# Patient Record
Sex: Female | Born: 1960 | Race: White | Hispanic: No | Marital: Married | Smoking: Never smoker
Health system: Southern US, Community
[De-identification: ages and names within clinical notes are randomized; demographics above are authoritative.]

## PROBLEM LIST (undated history)

## (undated) DIAGNOSIS — E785 Hyperlipidemia, unspecified: Secondary | ICD-10-CM

## (undated) HISTORY — DX: Hyperlipidemia, unspecified: E78.5

---
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
## 1988-02-10 HISTORY — PX: ABDOMINOPLASTY: SUR9

## 1998-02-09 HISTORY — PX: BLADDER SUSPENSION: SHX72

## 1998-03-04 ENCOUNTER — Emergency Department (HOSPITAL_COMMUNITY): Admission: EM | Admit: 1998-03-04 | Discharge: 1998-03-04 | Payer: Self-pay | Admitting: Emergency Medicine

## 1998-03-04 ENCOUNTER — Encounter: Payer: Self-pay | Admitting: Emergency Medicine

## 2005-04-29 ENCOUNTER — Ambulatory Visit (HOSPITAL_BASED_OUTPATIENT_CLINIC_OR_DEPARTMENT_OTHER): Admission: RE | Admit: 2005-04-29 | Discharge: 2005-04-29 | Payer: Self-pay | Admitting: Urology

## 2009-11-13 ENCOUNTER — Encounter: Admission: RE | Admit: 2009-11-13 | Discharge: 2009-11-13 | Payer: Self-pay | Admitting: Internal Medicine

## 2010-06-27 NOTE — Op Note (Signed)
NAMEDENNIS, KILLILEA                ACCOUNT NO.:  [id number]   MEDICAL RECORD NO.:  [id number]          PATIENT TYPE:  AMB   LOCATION:  [facility]                         FACILITY:  [facility]   PHYSICIAN:  Bertram Millard. Dahlstedt, M.D.DATE OF BIRTH:  [birth year]   DATE OF PROCEDURE:  04/29/2005  DATE OF DISCHARGE:                                 OPERATIVE REPORT   PREOPERATIVE DIAGNOSIS:  Stress urinary incontinence.   POSTOPERATIVE DIAGNOSIS:  Stress urinary incontinence.   SURGICAL PROCEDURES:  Lynx suprapubic sling.   SURGEON:  Dr. Retta Diones   ANESTHESIA:  General with LMA.   COMPLICATIONS:  None.   ESTIMATED BLOOD LOSS:  50 mL.   COMPLICATIONS:  None.   BRIEF HISTORY:  A 50 year old female with significant stress urinary  incontinence.  She has had it for several years.  This is getting worse and  impacts her daily activities.  She has no symptoms of an overactive bladder.  Evaluation of the bladder revealed a positive Marshall test and a relatively  normal-appearing bladder.  She has a minimal cystocele.  She does have some  uterine descent.  At this point, she just desires surgical management of her  stress incontinence.  Risks and complications as well as alternatives to  surgery have been discussed with the patient at length.  She has also  received a book on female incontinence.   DESCRIPTION OF PROCEDURE:  Ms. Teena Dunk was identified in the holding area,  brought to the operating room after IV antibiotics were administered.  She  was placed in the dorsal lithotomy position after general anesthesia was  established.  Genitalia, perineum, and lower abdomen were prepped and  draped.  The posterior vaginal speculum was placed.  Her bladder was  catheterized.  The anterior vaginal wall along the midline was infiltrated  with 1% lidocaine with epinephrine.  Two separate stab incisions were made  just overlying the pubic area by 1 cm off the midline bilaterally.  A  midline incision  was made over the mid urethra and the anterior vaginal  wall.  Dissection was used sharply to raise vaginal flaps.  The needles from  the kit were then passed through the suprapubic stab wounds down to the  space of Retzius and to their corresponding locations on either side of the  urethra through the vaginal incision.  Inspection cystoscopically with both  the 12 the 70-degree lenses revealed no evident bladder injury.  The scope  was then removed.  The sling was then brought into position underneath the  urethra by pulling it up through the space of Retzius bilaterally with the  needles.  It was positioned underneath the urethra with a very small amount  of slack present - enough to admit the mid-aspect of a hemostat handle.  The  sheath was then excised.  Adequate positioning was again seen.  At this  point, the nylon sling was cut off underneath the skin incisions.  The  vaginal incision was then closed using a running 2-0 Vicryl.  A good layer  of vaginal fascia and epithelium was placed over top of  the sling material.  About 150 mL of fluid was left within the bladder.  The vagina was  irrigated.  No pack was left.   The patient tolerated the procedure well.  Sponge, needle, and instrument  counts were correct x2.  She was awakened and taken to the PACU in stable  condition.   She will be sent home after she voids.  A catheter will be placed if  necessary.  She was sent home on Keflex 500 mg b.i.d. for 5 days and  oxycodone/APAP 5/325, 1/2-1 p.o. q. 4 hours p.r.n.).  She will follow up in  1 week.      Bertram Millard. Dahlstedt, M.D.  Electronically Signed     SMD/MEDQ  D:  04/29/2005  T:  04/30/2005  Job:  [id number]

## 2012-11-25 ENCOUNTER — Other Ambulatory Visit (HOSPITAL_COMMUNITY)
Admission: RE | Admit: 2012-11-25 | Discharge: 2012-11-25 | Disposition: A | Discharge status: Home / Self Care | Payer: 59 | Source: Ambulatory Visit | Attending: Internal Medicine | Admitting: Internal Medicine

## 2012-11-25 ENCOUNTER — Other Ambulatory Visit: Payer: Self-pay | Admitting: Internal Medicine

## 2012-11-25 DIAGNOSIS — Z01419 Encounter for gynecological examination (general) (routine) without abnormal findings: Secondary | ICD-10-CM | POA: Insufficient documentation

## 2012-11-25 DIAGNOSIS — Z1151 Encounter for screening for human papillomavirus (HPV): Secondary | ICD-10-CM | POA: Insufficient documentation

## 2016-04-27 ENCOUNTER — Other Ambulatory Visit: Payer: Self-pay | Admitting: Internal Medicine

## 2016-04-27 DIAGNOSIS — Z1231 Encounter for screening mammogram for malignant neoplasm of breast: Secondary | ICD-10-CM

## 2016-05-15 ENCOUNTER — Encounter: Payer: Self-pay | Admitting: Radiology

## 2016-05-15 ENCOUNTER — Ambulatory Visit
Admission: RE | Admit: 2016-05-15 | Discharge: 2016-05-15 | Disposition: A | Discharge status: Home / Self Care | Payer: BLUE CROSS/BLUE SHIELD | Source: Ambulatory Visit | Attending: Internal Medicine | Admitting: Internal Medicine

## 2016-05-15 DIAGNOSIS — Z1231 Encounter for screening mammogram for malignant neoplasm of breast: Secondary | ICD-10-CM

## 2016-08-11 ENCOUNTER — Emergency Department (HOSPITAL_COMMUNITY): Payer: BLUE CROSS/BLUE SHIELD

## 2016-08-11 ENCOUNTER — Encounter (HOSPITAL_COMMUNITY): Payer: Self-pay | Admitting: Emergency Medicine

## 2016-08-11 ENCOUNTER — Emergency Department (HOSPITAL_COMMUNITY)
Admission: EM | Admit: 2016-08-11 | Discharge: 2016-08-11 | Disposition: A | Discharge status: Home / Self Care | Payer: BLUE CROSS/BLUE SHIELD | Attending: Emergency Medicine | Admitting: Emergency Medicine

## 2016-08-11 DIAGNOSIS — Y9352 Activity, horseback riding: Secondary | ICD-10-CM | POA: Diagnosis not present

## 2016-08-11 DIAGNOSIS — S32020A Wedge compression fracture of second lumbar vertebra, initial encounter for closed fracture: Secondary | ICD-10-CM | POA: Insufficient documentation

## 2016-08-11 DIAGNOSIS — T148XXA Other injury of unspecified body region, initial encounter: Secondary | ICD-10-CM

## 2016-08-11 DIAGNOSIS — Y998 Other external cause status: Secondary | ICD-10-CM | POA: Insufficient documentation

## 2016-08-11 DIAGNOSIS — S301XXA Contusion of abdominal wall, initial encounter: Secondary | ICD-10-CM | POA: Insufficient documentation

## 2016-08-11 DIAGNOSIS — S299XXA Unspecified injury of thorax, initial encounter: Secondary | ICD-10-CM | POA: Diagnosis present

## 2016-08-11 DIAGNOSIS — Z79899 Other long term (current) drug therapy: Secondary | ICD-10-CM | POA: Diagnosis not present

## 2016-08-11 DIAGNOSIS — R0781 Pleurodynia: Secondary | ICD-10-CM

## 2016-08-11 DIAGNOSIS — Y929 Unspecified place or not applicable: Secondary | ICD-10-CM | POA: Insufficient documentation

## 2016-08-11 MED ORDER — HYDROCODONE-ACETAMINOPHEN 5-325 MG PO TABS: 1.0000 | ORAL_TABLET | Freq: Three times a day (TID) | ORAL | 0 refills | Status: AC | PRN

## 2016-08-11 MED ORDER — METHOCARBAMOL 500 MG PO TABS: 500.0000 mg | ORAL_TABLET | Freq: Two times a day (BID) | ORAL | 0 refills | Status: DC

## 2016-08-11 MED ORDER — HYDROCODONE-ACETAMINOPHEN 5-325 MG PO TABS
1.0000 | ORAL_TABLET | Freq: Once | ORAL | Status: AC
  Administered 2016-08-11: 1 via ORAL
  Filled 2016-08-11: qty 1

## 2016-08-11 MED ORDER — METHOCARBAMOL 500 MG PO TABS
1000.0000 mg | ORAL_TABLET | Freq: Once | ORAL | Status: AC
  Administered 2016-08-11: 1000 mg via ORAL
  Filled 2016-08-11: qty 2

## 2016-08-11 MED ORDER — ACETAMINOPHEN 500 MG PO TABS: 1000.0000 mg | ORAL_TABLET | Freq: Three times a day (TID) | ORAL | 0 refills | Status: AC

## 2016-08-11 NOTE — ED Notes (Signed)
Pt ambulated to restroom without difficulty

## 2016-08-11 NOTE — ED Provider Notes (Signed)
[facility]-EMERGENCY DEPT Provider Note   CSN: [id number] Arrival date & time: 08/11/16  1314     History   Chief Complaint Chief Complaint  Patient presents with  . Back Pain    HPI Christina Beltran is a 56 y.o. female.  HPI  56 year old female with no pertinent past medical history presents the emergency department after of fall from a horse. Patient reports that the fall occurred at 9:00 this morning, 7 hours prior to arrival. She reports falling off the horse landing onto her back/right flank. Denies any head trauma or loss of consciousness. Patient is endorsing upper and lower back pain as well as right flank pain. She denies any perineal/saddle anesthesia, lower extremity weakness, bladder or bowel incontinence.  History reviewed. No pertinent past medical history.  There are no active problems to display for this patient.   History reviewed. No pertinent surgical history.  OB History    No data available       Home Medications    Prior to Admission medications   Medication Sig Start Date End Date Taking? Authorizing Provider  naproxen sodium (ANAPROX) 220 MG tablet Take 220 mg by mouth 2 (two) times daily with a meal.   Yes [provider]  acetaminophen (TYLENOL) 500 MG tablet Take 2 tablets (1,000 mg total) by mouth every 8 (eight) hours. Do not take more than 4000 mg of acetaminophen (Tylenol) in a 24-hour period. Please note that other medicines that you may be prescribed may have Tylenol as well. 08/11/16 08/16/16  Nira Conn, MD  HYDROcodone-acetaminophen (NORCO/VICODIN) 5-325 MG tablet Take 1 tablet by mouth every 8 (eight) hours as needed for severe pain (That is not improved by your scheduled acetaminophen regimen). Please do not exceed 4000 mg of acetaminophen (Tylenol) a 24-hour period. Please note that he may be prescribed additional medicine that contains acetaminophen. 08/11/16 08/16/16  Nira Conn, MD  methocarbamol (ROBAXIN) 500 MG tablet  Take 1 tablet (500 mg total) by mouth 2 (two) times daily. 08/11/16   Nira Conn, MD    Family History Family History  Problem Relation Age of Onset  . Breast cancer Mother   . Breast cancer Maternal Grandmother     Social History Social History  Substance Use Topics  . Smoking status: Not on file  . Smokeless tobacco: Not on file  . Alcohol use Not on file     Allergies   Patient has no known allergies.   Review of Systems Review of Systems All other systems are reviewed and are negative for acute change except as noted in the HPI  Physical Exam Updated Vital Signs BP 121/79 (BP Location: Left Arm)   Pulse 79   Temp 98.2 F (36.8 C) (Oral)   Resp 16   SpO2 97%   Physical Exam  Constitutional: She is oriented to person, place, and time. She appears well-developed and well-nourished. No distress.  HENT:  Head: Normocephalic and atraumatic.  Right Ear: External ear normal.  Left Ear: External ear normal.  Nose: Nose normal.  Eyes: Conjunctivae and EOM are normal. Pupils are equal, round, and reactive to light. Right eye exhibits no discharge. Left eye exhibits no discharge. No scleral icterus.  Neck: Normal range of motion. Neck supple.  Cardiovascular: Normal rate, regular rhythm and normal heart sounds.  Exam reveals no gallop and no friction rub.   No murmur heard. Pulses:      Radial pulses are 2+ on the right side, and 2+  on the left side.       Dorsalis pedis pulses are 2+ on the right side, and 2+ on the left side.  Pulmonary/Chest: Effort normal and breath sounds normal. No stridor. No respiratory distress. She has no wheezes.  Abdominal: Soft. She exhibits no distension. There is no tenderness.  Musculoskeletal: She exhibits no edema.       Cervical back: She exhibits no bony tenderness.       Thoracic back: She exhibits tenderness. She exhibits no bony tenderness.       Lumbar back: She exhibits tenderness and bony tenderness.        Back:  Clavicles stable. Chest stable to AP/Lat compression. Pelvis stable to Lat compression. No obvious extremity deformity. No chest or abdominal wall contusion.  Neurological: She is alert and oriented to person, place, and time.  Spine Exam: Strength: 5/5 throughout LE bilaterally (hip flexion/extension, adduction/abduction; knee flexion/extension; foot dorsiflexion/plantarflexion, inversion/eversion; great toe inversion) Sensation: Intact to light touch in proximal and distal LE bilaterally Reflexes: 2+ quadriceps and achilles reflexes    Skin: Skin is warm and dry. No rash noted. She is not diaphoretic. No erythema.  Psychiatric: She has a normal mood and affect.     ED Treatments / Results  Labs (all labs ordered are listed, but only abnormal results are displayed) Labs Reviewed - No data to display  EKG  EKG Interpretation None       Radiology Dg Ribs Unilateral W/chest Right  Result Date: 08/11/2016 CLINICAL DATA:  Patient thrown from horse EXAM: RIGHT RIBS AND CHEST - 3+ VIEW COMPARISON:  None. FINDINGS: Frontal chest as well as oblique and cone-down lower rib images were obtained. Lungs are clear. Heart size and pulmonary vascularity are normal. No adenopathy. There is no evident pneumothorax or pleural effusion. No rib fracture appreciable. IMPRESSION: No appreciable rib fracture.  Lungs clear. Electronically Signed   By: Bretta Bang III M.D.   On: 08/11/2016 16:08   Dg Thoracic Spine 2 View  Result Date: 08/11/2016 CLINICAL DATA:  Upper back pain following being thrown from horse, initial encounter EXAM: THORACIC SPINE 2 VIEWS COMPARISON:  None. FINDINGS: Mild degenerative changes of the lower cervical and thoracic spine are noted. No acute fracture is seen. No acute rib abnormality is noted. IMPRESSION: Mild degenerative change without acute abnormality. Electronically Signed   By: Alcide Clever M.D.   On: 08/11/2016 14:50   Dg Lumbar Spine Complete  Result  Date: 08/11/2016 CLINICAL DATA:  Low back pain following being thrown from horse, initial encounter EXAM: LUMBAR SPINE - COMPLETE 4+ VIEW COMPARISON:  None. FINDINGS: Five lumbar type vertebral bodies are well visualized. Vertebral body height is well maintained with the exception of the L2 vertebral body which demonstrates anterior wedging of approximately 20%. Facet hypertrophic changes are noted. No other fractures are seen. Increased soft tissue density is noted consistent with the given clinical history of hematoma. IMPRESSION: L2 compression deformity which given the patient's clinical history is felt to be acute in nature. CT evaluation is recommended for further detail. Degenerative changes without acute abnormality. Electronically Signed   By: Alcide Clever M.D.   On: 08/11/2016 14:52   Dg Pelvis 1-2 Views  Result Date: 08/11/2016 CLINICAL DATA:  Thrown from horse this morning with pelvic pain, initial encounter EXAM: PELVIS - 1-2 VIEW COMPARISON:  None. FINDINGS: There is no evidence of pelvic fracture or diastasis. No pelvic bone lesions are seen. IMPRESSION: No acute abnormality noted. Electronically Signed  By: Alcide CleverMark  Lukens M.D.   On: 08/11/2016 14:53    Procedures Procedures (including critical care time)  Medications Ordered in ED Medications  HYDROcodone-acetaminophen (NORCO/VICODIN) 5-325 MG per tablet 1 tablet (1 tablet Oral Given 08/11/16 1538)  methocarbamol (ROBAXIN) tablet 1,000 mg (1,000 mg Oral Given 08/11/16 1538)     Initial Impression / Assessment and Plan / ED Course  I have reviewed the triage vital signs and the nursing notes.  Pertinent labs & imaging results that were available during my care of the patient were reviewed by me and considered in my medical decision making (see chart for details).     Workup consistent with right flank hematoma, L2 compression fracture. No evidence to suggest cauda equina. Patient provided with by mouth pain medicine and muscle  relaxers.  Recommended lumbar corset for comfort. Patient will need to follow up with neurosurgery in 4 weeks for repeat imaging and further management of her L2 compression fracture.  The patient is safe for discharge with strict return precautions.   Final Clinical Impressions(s) / ED Diagnoses   Final diagnoses:  Fall from horse, initial encounter  Hematoma of right flank, initial encounter  Closed compression fracture of second lumbar vertebra, initial encounter (HCC)  Muscle strain  Rib pain on right side   Disposition: Discharge  Condition: Good  I have discussed the results, Dx and Tx plan with the patient who expressed understanding and agree(s) with the plan. Discharge instructions discussed at great length. The patient was given strict return precautions who verbalized understanding of the instructions. No further questions at time of discharge.    New Prescriptions   ACETAMINOPHEN (TYLENOL) 500 MG TABLET    Take 2 tablets (1,000 mg total) by mouth every 8 (eight) hours. Do not take more than 4000 mg of acetaminophen (Tylenol) in a 24-hour period. Please note that other medicines that you may be prescribed may have Tylenol as well.   HYDROCODONE-ACETAMINOPHEN (NORCO/VICODIN) 5-325 MG TABLET    Take 1 tablet by mouth every 8 (eight) hours as needed for severe pain (That is not improved by your scheduled acetaminophen regimen). Please do not exceed 4000 mg of acetaminophen (Tylenol) a 24-hour period. Please note that he may be prescribed additional medicine that contains acetaminophen.   METHOCARBAMOL (ROBAXIN) 500 MG TABLET    Take 1 tablet (500 mg total) by mouth 2 (two) times daily.    Follow Up: Marden NobleGates, Robert, MD [address] [place] [address] [phone]  Schedule an appointment as soon as possible for a visit  As needed  Julio SicksPool, Henry, MD [address] [place] [address] [phone]  In 4 weeks For close follow up to  assess for L2 compression fracture      Lorell Thibodaux, Amadeo GarnetPedro Eduardo, MD 08/11/16 [phone]

## 2016-08-11 NOTE — ED Notes (Signed)
Patient transported to X-ray 

## 2016-08-11 NOTE — ED Notes (Signed)
There was 61 ml of urine as shown by bladder scan.

## 2016-08-11 NOTE — ED Triage Notes (Signed)
Patient reports she was thrown off of a horse this morning. C/o hematoma to right lower back but c/o pain to entire back that worsens with movement. Denies head injury and LOC.

## 2018-12-30 ENCOUNTER — Other Ambulatory Visit: Payer: Self-pay

## 2018-12-30 DIAGNOSIS — Z20822 Contact with and (suspected) exposure to covid-19: Secondary | ICD-10-CM

## 2019-01-02 LAB — NOVEL CORONAVIRUS, NAA: SARS-CoV-2, NAA: NOT DETECTED

## 2019-01-19 ENCOUNTER — Other Ambulatory Visit: Payer: Self-pay

## 2019-01-19 DIAGNOSIS — Z20822 Contact with and (suspected) exposure to covid-19: Secondary | ICD-10-CM

## 2019-01-21 LAB — NOVEL CORONAVIRUS, NAA: SARS-CoV-2, NAA: NOT DETECTED

## 2019-11-02 ENCOUNTER — Other Ambulatory Visit: Payer: Self-pay | Admitting: Internal Medicine

## 2019-11-02 DIAGNOSIS — Z1231 Encounter for screening mammogram for malignant neoplasm of breast: Secondary | ICD-10-CM

## 2019-11-15 ENCOUNTER — Other Ambulatory Visit: Payer: Self-pay

## 2019-11-15 ENCOUNTER — Ambulatory Visit
Admission: RE | Admit: 2019-11-15 | Discharge: 2019-11-15 | Disposition: A | Discharge status: Home / Self Care | Payer: BC Managed Care – PPO | Source: Ambulatory Visit | Attending: Internal Medicine | Admitting: Internal Medicine

## 2019-11-15 DIAGNOSIS — Z1231 Encounter for screening mammogram for malignant neoplasm of breast: Secondary | ICD-10-CM

## 2019-11-20 ENCOUNTER — Other Ambulatory Visit: Payer: Self-pay

## 2019-11-20 ENCOUNTER — Ambulatory Visit: Payer: BC Managed Care – PPO | Admitting: Cardiology

## 2019-11-20 ENCOUNTER — Encounter: Payer: Self-pay | Admitting: Cardiology

## 2019-11-20 VITALS — BP 132/67 | HR 68 | Resp 15 | Ht 65.0 in | Wt 173.0 lb

## 2019-11-20 DIAGNOSIS — Z01810 Encounter for preprocedural cardiovascular examination: Secondary | ICD-10-CM

## 2019-11-20 DIAGNOSIS — E7800 Pure hypercholesterolemia, unspecified: Secondary | ICD-10-CM

## 2019-11-20 DIAGNOSIS — R9431 Abnormal electrocardiogram [ECG] [EKG]: Secondary | ICD-10-CM

## 2019-11-20 NOTE — Progress Notes (Signed)
Date:  11/20/2019   ID:  Christina Beltran, DOB 10/09/1960, MRN [id number]  PCP:  Josetta Huddle, MD  Cardiologist:  Rex Kras, DO, [facility] (established care 11/20/2019)  REASON FOR CONSULT: Abnormal EKG  REQUESTING PHYSICIAN:  Josetta Huddle, MD [address] [place],  [place] [address]  Chief Complaint  Patient presents with  . New Patient (Initial Visit)  . Surgical Clearance  . Abnormal ECG    HPI  Christina Beltran is a 59 y.o. female who presents to the office with a chief complaint of " abnormal EKG and surgical clearance." Patient's past medical history and cardiovascular risk factors include: History of hyperthyroidism status post radioactive iodine, hypercholesterolemia, vitamin D deficiency.  She is referred to the office at the request of Josetta Huddle, MD for evaluation of abnormal EKG and pre-op clearance.  Patient is being scheduled for a neck lift plastic surgery on December 04, 2019 with Dr. Kellie Simmering at [facility] plastic surgery.  Patient states that the surgery scheduled for 4 hours under general anesthesia.  She denies any chest pain or shortness of breath at rest or with effort related activities.  She is not a diabetic.  No structured exercise program or daily routine.  However, patient states that she rides horses 3 days a week. Denies prior history of coronary artery disease, myocardial infarction, congestive heart failure, deep venous thrombosis, pulmonary embolism, stroke, transient ischemic attack.  She recently went to her PCP for preoperative risk stratification and surface EKG was reported to be abnormal and referred to cardiology for further evaluation and management.  Patient has underlying hypercholesterolemia and is currently not on statin therapy.  Patient is considering statin therapy and will discuss it further with her PCP.  No family history of premature coronary disease or sudden cardiac death.  ALLERGIES: Allergies  Allergen Reactions  . Latex      MEDICATION LIST PRIOR TO VISIT: No outpatient medications have been marked as taking for the 11/20/19 encounter (Office Visit) with Terri Skains, Treyvonne Tata, DO.     PAST MEDICAL HISTORY: Past Medical History:  Diagnosis Date  . Hyperlipidemia     PAST SURGICAL HISTORY: Past Surgical History:  Procedure Laterality Date  . ABDOMINOPLASTY  1990  . BLADDER SUSPENSION  2000  . CESAREAN SECTION  1988  . compound fracture  1978   R arm x3 procedures   . RHINOPLASTY  1980    FAMILY HISTORY: The patient family history includes Breast cancer in her maternal grandmother and mother; Hyperlipidemia in her mother; Hypertension in her mother.  SOCIAL HISTORY:  The patient  reports that she has never smoked. She has never used smokeless tobacco. She reports current alcohol use of about 5.0 standard drinks of alcohol per week. She reports that she does not use drugs.  REVIEW OF SYSTEMS: Review of Systems  Constitutional: Negative for chills and fever.  HENT: Negative for hoarse voice and nosebleeds.   Eyes: Negative for discharge, double vision and pain.  Cardiovascular: Negative for chest pain, claudication, dyspnea on exertion, leg swelling, near-syncope, orthopnea, palpitations, paroxysmal nocturnal dyspnea and syncope.  Respiratory: Negative for hemoptysis and shortness of breath.   Musculoskeletal: Negative for muscle cramps and myalgias.  Gastrointestinal: Negative for abdominal pain, constipation, diarrhea, hematemesis, hematochezia, melena, nausea and vomiting.  Neurological: Negative for dizziness and light-headedness.    PHYSICAL EXAM: Vitals with BMI 11/20/2019 08/11/2016 08/11/2016  Height _0  - -  Weight 173 lbs - -  BMI 75.17 - -  Systolic 001  357 017  Diastolic 67 62 79  Pulse 68 51 79   CONSTITUTIONAL: Well-developed and well-nourished. No acute distress.  SKIN: Skin is warm and dry. No rash noted. No cyanosis. No pallor. No jaundice HEAD: Normocephalic and atraumatic.   EYES: No scleral icterus MOUTH/THROAT: Moist oral membranes.  NECK: No JVD present. No thyromegaly noted. No carotid bruits  LYMPHATIC: No visible cervical adenopathy.  CHEST Normal respiratory effort. No intercostal retractions  LUNGS: Clear to auscultation bilaterally.  No stridor. No wheezes. No rales.  CARDIOVASCULAR: Regular rate and rhythm, positive S1-S2, no murmurs rubs or gallops appreciated. ABDOMINAL: Soft, nontender, nondistended, positive bowel sounds all 4 quadrants, no apparent ascites.  EXTREMITIES: No peripheral edema, 2+ dorsalis pedis and posterior tibial pulses.,  Warm to touch bilaterally. HEMATOLOGIC: No significant bruising NEUROLOGIC: Oriented to person, place, and time. Nonfocal. Normal muscle tone.  PSYCHIATRIC: Normal mood and affect. Normal behavior. Cooperative  CARDIAC DATABASE: EKG: 11/20/2019: Sinus  Rhythm, 70bpm, normal axis, LAE, diffuse TWI in precoridal leads and aVL, lead I, without injury pattern.   Echocardiogram: No results found for this or any previous visit from the past 1095 days.    Stress Testing: No results found for this or any previous visit from the past 1095 days.   Heart Catheterization: None  LABORATORY DATA: External Labs: Collected: 11/02/2019 Creatinine 0.83 mg/dL. eGFR: 70 mL/min per 1.73 m Lipid profile: Total cholesterol 254, triglycerides 194, HDL 52, LDL 166, non-HDL 202 TSH: 3.99   IMPRESSION:    ICD-10-CM   1. Preoperative cardiovascular examination  Z01.810 EKG 12-Lead    PCV ECHOCARDIOGRAM COMPLETE    PCV MYOCARDIAL PERFUSION WO LEXISCAN  2. Abnormal EKG  R94.31 PCV ECHOCARDIOGRAM COMPLETE    PCV MYOCARDIAL PERFUSION WO LEXISCAN  3. Hypercholesteremia  E78.00      RECOMMENDATIONS: [name] is a 59 y.o. female whose past medical history and cardiac risk factors include: History of hyperthyroidism status post radioactive iodine, hypercholesterolemia, vitamin D deficiency.  Abnormal EKG and preop risk  stratification: Patient underlying rhythm is sinus with diffuse T wave inversions in the precordial leads as well as the high lateral leads I and aVL.  No prior EKGs to compare.  Symptomatically she denies any chest pain or shortness of breath.  However patient does not have regular exercise program or daily routine as she follows.  Her cardiovascular risk factors are untreated hypercholesterolemia.  Prior to upcoming noncardiac surgery would recommend an ischemic evaluation. Echocardiogram will be ordered to evaluate for structural heart disease and left ventricular systolic function. Plan exercise nuclear stress test to evaluate for functional capacity and reversible ischemia.  Hypercholesterolemia: Patient states that she will follow-up with her PCP in regards the management of her hypercholesterolemia.  Reemphasized the importance of lifestyle modifications in addition to pharmacological therapy.  Further recommendations to follow.  FINAL MEDICATION LIST END OF ENCOUNTER: No orders of the defined types were placed in this encounter.   No current outpatient medications on file.  Orders Placed This Encounter  Procedures  . PCV MYOCARDIAL PERFUSION WO LEXISCAN  . EKG 12-Lead  . PCV ECHOCARDIOGRAM COMPLETE    There are no Patient Instructions on file for this visit.   --Continue cardiac medications as reconciled in final medication list. --Return in about 2 weeks (around 12/04/2019) for Pre-op and , Review test results. Or sooner if needed. --Continue follow-up with your primary care physician regarding the management of your other chronic comorbid conditions.  Patient's questions and concerns were addressed to  her satisfaction. She voices understanding of the instructions provided during this encounter.   This note was created using a voice recognition software as a result there may be grammatical errors inadvertently enclosed that do not reflect the nature of this encounter. Every  attempt is made to correct such errors.  Rex Kras, [place], [facility]  Pager: [phone] Office: [phone]

## 2019-11-21 ENCOUNTER — Other Ambulatory Visit: Payer: Self-pay

## 2019-11-21 DIAGNOSIS — R9431 Abnormal electrocardiogram [ECG] [EKG]: Secondary | ICD-10-CM

## 2019-11-21 DIAGNOSIS — Z01810 Encounter for preprocedural cardiovascular examination: Secondary | ICD-10-CM

## 2019-11-22 ENCOUNTER — Ambulatory Visit: Payer: BC Managed Care – PPO

## 2019-11-22 ENCOUNTER — Other Ambulatory Visit: Payer: Self-pay

## 2019-11-27 ENCOUNTER — Other Ambulatory Visit: Payer: BC Managed Care – PPO

## 2019-11-29 ENCOUNTER — Ambulatory Visit: Payer: BC Managed Care – PPO

## 2019-11-29 ENCOUNTER — Other Ambulatory Visit: Payer: Self-pay

## 2019-11-30 ENCOUNTER — Other Ambulatory Visit: Payer: Self-pay | Admitting: Cardiology

## 2019-11-30 ENCOUNTER — Telehealth: Payer: Self-pay

## 2019-11-30 DIAGNOSIS — E7800 Pure hypercholesterolemia, unspecified: Secondary | ICD-10-CM

## 2019-11-30 DIAGNOSIS — Z01810 Encounter for preprocedural cardiovascular examination: Secondary | ICD-10-CM

## 2019-11-30 DIAGNOSIS — Z712 Person consulting for explanation of examination or test findings: Secondary | ICD-10-CM

## 2019-11-30 MED ORDER — METOPROLOL SUCCINATE ER 25 MG PO TB24: 25.0000 mg | ORAL_TABLET | Freq: Every morning | ORAL | 0 refills | Status: DC

## 2019-11-30 MED ORDER — ATORVASTATIN CALCIUM 20 MG PO TABS: 20.0000 mg | ORAL_TABLET | Freq: Every day | ORAL | 0 refills | Status: DC

## 2019-11-30 NOTE — Telephone Encounter (Signed)
Please let me know as per our discussion.

## 2019-11-30 NOTE — Progress Notes (Signed)
Telephone encounter:  Outside records Collected: 11/02/2019 Creatinine 0.83 mg/dL. eGFR: 70 mL/min per 1.73 m Lipid profile: Total cholesterol 254, triglycerides 194, HDL 52, LDL 166, non-HDL 202 AST 14 ALT 16 Alkaline phosphatase 77 TSH: 3.99   Given the uncontrolled hyperlipidemia shared decision was to start statin therapy.   Will prescribe Lipitor 20 mg p.o. nightly. Repeat fasting lipid profile and CMP in 6 weeks to evaluate therapy.  We will see the patient back in 7 weeks in the office.  Echocardiogram notes preserved LVEF, normal diastolic filling pattern, mild MR.  Patient's nuclear stress test was overall low risk study.  During peak stress patient did have episodes of isolated PVCs and ventricular couplets.  Since patient is undergoing an elective surgery with planned general anesthesia time of 4 hours would recommend initiation of Toprol-XL 25 mg p.o. daily and postponing the surgery for 2 weeks.  From a cardiovascular standpoint patient still presents a low risk candidate for upcoming noncardiac surgery. This preoperative risk assessment is a tool to assist the surgeon in estimating the cardiac risk for the proposed upcoming noncardiac surgery.  The shared decision to proceed with surgery will be ultimately at the discretion of the patient after the surgical risks, benefits, and alternatives have been discussed amongst the patient and her surgical team.  Patient is agreeable with the plan of care and finds the preoperative risk stratification acceptable.    ICD-10-CM   1. Preoperative cardiovascular examination  Z01.810   2. Hypercholesteremia  E78.00 Lipid Panel With LDL/HDL Ratio    CMP14+EGFR  3. Encounter to discuss test results  Z71.2    Total time spent 11 minutes.  Rex Kras, [place], [facility]  Pager: [phone] Office: [phone]

## 2019-11-30 NOTE — Telephone Encounter (Signed)
Issue has been addressed. Dr Odis Hollingshead does not have to call Dr Candie Chroman office.   -Cleotis Nipper

## 2019-12-01 ENCOUNTER — Ambulatory Visit: Payer: BC Managed Care – PPO | Admitting: Cardiology

## 2020-01-11 LAB — LIPID PANEL WITH LDL/HDL RATIO
Cholesterol, Total: 162 mg/dL (ref 100–199)
HDL: 51 mg/dL (ref >39)
LDL Chol Calc (NIH): 93 mg/dL (ref 0–99)
LDL/HDL Ratio: 1.8 ratio (ref 0.0–3.2)
Triglycerides: 101 mg/dL (ref 0–149)
VLDL Cholesterol Cal: 18 mg/dL (ref 5–40)

## 2020-01-11 LAB — CMP14+EGFR
ALT: 19 IU/L (ref 0–32)
AST: 16 IU/L (ref 0–40)
Albumin/Globulin Ratio: 2.3 — ABNORMAL HIGH (ref 1.2–2.2)
Albumin: 4.6 g/dL (ref 3.8–4.9)
Alkaline Phosphatase: 91 IU/L (ref 44–121)
BUN/Creatinine Ratio: 14 (ref 12–28)
BUN: 13 mg/dL (ref 8–27)
Bilirubin Total: 1.1 mg/dL (ref 0.0–1.2)
CO2: 23 mmol/L (ref 20–29)
Calcium: 9.8 mg/dL (ref 8.7–10.3)
Chloride: 108 mmol/L — ABNORMAL HIGH (ref 96–106)
Creatinine, Ser: 0.96 mg/dL (ref 0.57–1.00)
GFR calc Af Amer: 74 mL/min/{1.73_m2} (ref >59)
GFR calc non Af Amer: 64 mL/min/{1.73_m2} (ref >59)
Globulin, Total: 2 g/dL (ref 1.5–4.5)
Glucose: 94 mg/dL (ref 65–99)
Potassium: 4.5 mmol/L (ref 3.5–5.2)
Sodium: 143 mmol/L (ref 134–144)
Total Protein: 6.6 g/dL (ref 6.0–8.5)

## 2020-01-25 ENCOUNTER — Ambulatory Visit: Payer: BC Managed Care – PPO | Admitting: Cardiology

## 2020-01-25 ENCOUNTER — Other Ambulatory Visit: Payer: Self-pay

## 2020-01-25 ENCOUNTER — Encounter: Payer: Self-pay | Admitting: Cardiology

## 2020-01-25 VITALS — BP 128/82 | HR 54 | Resp 16 | Ht 65.0 in | Wt 178.0 lb

## 2020-01-25 DIAGNOSIS — R9431 Abnormal electrocardiogram [ECG] [EKG]: Secondary | ICD-10-CM

## 2020-01-25 DIAGNOSIS — E7800 Pure hypercholesterolemia, unspecified: Secondary | ICD-10-CM

## 2020-01-25 NOTE — Progress Notes (Signed)
Date:  01/25/2020   ID:  Christina Beltran, DOB 03-May-1960, MRN [id number]  PCP:  Josetta Huddle, MD  Cardiologist:  Rex Kras, DO, [facility] (established care 11/20/2019)  Chief Complaint  Patient presents with   Follow-up   Hyperlipidemia    HPI  Christina Beltran is a 59 y.o. female who presents to the office with a chief complaint of " follow up hyperlipidemia." Patient's past medical history and cardiovascular risk factors include: History of hyperthyroidism status post radioactive iodine, hypercholesterolemia, vitamin Christina deficiency.  She is referred to the office at the request of Josetta Huddle, MD for evaluation of abnormal EKG and pre-op clearance.  Patient was referred to the office for pre-op evaluation prior to her neck lift plastic surgery on December 04, 2019 with Dr. Kellie Simmering at [facility] plastic surgery as she had an abnormal EKG. She underwent echo and stress test results were reviewed with her over the phone at that time. She was asked to start Toprol XL due to PVC noted an stress ECG and delay her surgery by two weeks.   However, due to scheduling conflicts and patient traveling to [place] the surgery has been postponed and still not performed.  Patient has tolerated the initiation of statin therapy well without any side effects or intolerances.  Patient did have a repeat lipid profile which showed favorable results.   No family history of premature coronary disease or sudden cardiac death.  ALLERGIES: Allergies  Allergen Reactions   Latex     MEDICATION LIST PRIOR TO VISIT: Current Meds  Medication Sig   atorvastatin (LIPITOR) 20 MG tablet Take 1 tablet (20 mg total) by mouth at bedtime.   Cholecalciferol (Christina-3-5) 125 MCG (5000 UT) capsule Take 5,000 Units by mouth daily.   metoprolol succinate (TOPROL XL) 25 MG 24 hr tablet Take 1 tablet (25 mg total) by mouth in the morning.     PAST MEDICAL HISTORY: Past Medical History:  Diagnosis Date   Hyperlipidemia     PAST  SURGICAL HISTORY: Past Surgical History:  Procedure Laterality Date   ABDOMINOPLASTY  1990   BLADDER SUSPENSION  2000   CESAREAN SECTION  1988   compound fracture  1978   R arm x3 procedures    RHINOPLASTY  1980    FAMILY HISTORY: The patient family history includes Breast cancer in her maternal grandmother and mother; Hyperlipidemia in her mother; Hypertension in her mother.  SOCIAL HISTORY:  The patient  reports that she has never smoked. She has never used smokeless tobacco. She reports current alcohol use of about 5.0 standard drinks of alcohol per week. She reports that she does not use drugs.  REVIEW OF SYSTEMS: Review of Systems  Constitutional: Negative for chills and fever.  HENT: Negative for hoarse voice and nosebleeds.   Eyes: Negative for discharge, double vision and pain.  Cardiovascular: Negative for chest pain, claudication, dyspnea on exertion, leg swelling, near-syncope, orthopnea, palpitations, paroxysmal nocturnal dyspnea and syncope.  Respiratory: Negative for hemoptysis and shortness of breath.   Musculoskeletal: Negative for muscle cramps and myalgias.  Gastrointestinal: Negative for abdominal pain, constipation, diarrhea, hematemesis, hematochezia, melena, nausea and vomiting.  Neurological: Negative for dizziness and light-headedness.   PHYSICAL EXAM: Vitals with BMI 01/25/2020 11/20/2019 08/11/2016  Height _0  _1  -  Weight 178 lbs 173 lbs -  BMI 09.23 30.07 -  Systolic 622 633 354  Diastolic 82 67 62  Pulse 54 68 51   CONSTITUTIONAL: Well-developed and well-nourished. No acute distress.  SKIN: Skin is warm and dry. No rash noted. No cyanosis. No pallor. No jaundice HEAD: Normocephalic and atraumatic.  EYES: No scleral icterus MOUTH/THROAT: Moist oral membranes.  NECK: No JVD present. No thyromegaly noted. No carotid bruits  LYMPHATIC: No visible cervical adenopathy.  CHEST Normal respiratory effort. No intercostal retractions  LUNGS:  Clear to auscultation bilaterally.  No stridor. No wheezes. No rales.  CARDIOVASCULAR: Regular rate and rhythm, positive S1-S2, no murmurs rubs or gallops appreciated. ABDOMINAL: Soft, nontender, nondistended, positive bowel sounds all 4 quadrants, no apparent ascites.  EXTREMITIES: No peripheral edema, 2+ dorsalis pedis and posterior tibial pulses.,  Warm to touch bilaterally. HEMATOLOGIC: No significant bruising NEUROLOGIC: Oriented to person, place, and time. Nonfocal. Normal muscle tone.  PSYCHIATRIC: Normal mood and affect. Normal behavior. Cooperative  CARDIAC DATABASE: EKG: 01/25/2020: Sinus bradycardia, 53 bpm, normal axis, T wave inversions in the high lateral and lateral leads suggestive of ischemia, without underlying injury pattern. No significant change compared to prior ECG.  Echocardiogram: 11/22/2019:  Left ventricle cavity is normal in size. Mild asymmetric hypertrophy of the left ventricle. Septum measures 1.4 cm. Normal LV systolic function with EF 66%. Normal global wall motion. Normal diastolic filling pattern. Left atrial cavity is mildly dilated.  Mild (Grade I) mitral regurgitation.  Normal right atrial pressure.   Stress Testing: Exercise Sestamibi stress test 11/29/2019: Exercise nuclear stress test was performed using Bruce protocol. Patient exercised for 71mn, reached 7.82METS, and 86% of age predicted maximum heart rate.  Equivocal ECG stress. Myocardial perfusion is normal. Stress LV EF: 60%.  Overall LV systolic function is normal without regional wall motion abnormalities. No previous exam available for comparison. Low risk study.  Heart Catheterization: None  LABORATORY DATA: External Labs: Collected: 11/02/2019 Creatinine 0.83 mg/dL. eGFR: 70 mL/min per 1.73 m Lipid profile: Total cholesterol 254, triglycerides 194, HDL 52, LDL 166, non-HDL 202 TSH: 3.99   Lab Results  Component Value Date   CHOL 162 01/10/2020   HDL 51 01/10/2020    LDLCALC 93 01/10/2020   TRIG 101 01/10/2020    IMPRESSION:    ICD-10-CM   1. Abnormal EKG  R94.31 EKG 12-Lead  2. Hypercholesteremia  E78.00 CMP14+EGFR    Lipid Panel With LDL/HDL Ratio    LDL cholesterol, direct     RECOMMENDATIONS: DCherica Heidenis a 59y.o. female whose past medical history and cardiac risk factors include: History of hyperthyroidism status post radioactive iodine, hypercholesterolemia, vitamin Christina deficiency.  Abnormal EKG and preop risk stratification: Echocardiogram notes preserved LVEF, normal diastolic filling pattern, mild MR. Patient's nuclear stress test was overall low risk study.  During peak stress patient did have episodes of isolated PVCs and ventricular couplets.  She was started on beta blocker therapy and has done well. Continue Toprol XL 237mpo qday.   Hypercholesterolemia: Improved. Started Lipitor 20 mg p.o. nightly back in October 2020.  Follow-up blood work from 01/10/2020 independently reviewed with her during today's office visit.   LDL levels have improved.   Continue current medical therapy. Encourage lifestyle & dietary changes.  Patient does not endorse any evidence of myalgias or other side effects. Reemphasized the importance of risk factor modifications and increasing physical activity to 30 minutes a day 5 days a week as tolerated. Patient can follow up with repeat lipid profile at her annual yearly physical and I will see her back in one year to re-evaluate therapy and labs prior to that appt..  Patient agreeable with the plan of care.  Review test results: Reviewed the most recent echo and stress test results with the patien at today's office visit.   FINAL MEDICATION LIST END OF ENCOUNTER: No orders of the defined types were placed in this encounter.    Current Outpatient Medications:    atorvastatin (LIPITOR) 20 MG tablet, Take 1 tablet (20 mg total) by mouth at bedtime., Disp: 90 tablet, Rfl: 0   Cholecalciferol (Christina-3-5) 125 MCG  (5000 UT) capsule, Take 5,000 Units by mouth daily., Disp: , Rfl:    metoprolol succinate (TOPROL XL) 25 MG 24 hr tablet, Take 1 tablet (25 mg total) by mouth in the morning., Disp: 90 tablet, Rfl: 0  Orders Placed This Encounter  Procedures   CMP14+EGFR   Lipid Panel With LDL/HDL Ratio   LDL cholesterol, direct   EKG 12-Lead   There are no Patient Instructions on file for this visit.   --Continue cardiac medications as reconciled in final medication list. --Return in about 1 year (around 01/24/2021) for Follow up, Lipid. Or sooner if needed. --Continue follow-up with your primary care physician regarding the management of your other chronic comorbid conditions.  Patient's questions and concerns were addressed to her satisfaction. She voices understanding of the instructions provided during this encounter.   This note was created using a voice recognition software as a result there may be grammatical errors inadvertently enclosed that do not reflect the nature of this encounter. Every attempt is made to correct such errors.  Rex Kras, [place], [facility]  Pager: [phone] Office: [phone]

## 2020-02-20 ENCOUNTER — Other Ambulatory Visit: Payer: Self-pay | Admitting: Cardiology

## 2020-05-18 ENCOUNTER — Other Ambulatory Visit: Payer: Self-pay | Admitting: Cardiology

## 2020-08-25 ENCOUNTER — Other Ambulatory Visit: Payer: Self-pay | Admitting: Cardiology

## 2020-12-05 ENCOUNTER — Other Ambulatory Visit: Payer: Self-pay | Admitting: Cardiology

## 2021-02-03 ENCOUNTER — Other Ambulatory Visit: Payer: Self-pay | Admitting: Cardiology

## 2021-07-11 ENCOUNTER — Other Ambulatory Visit: Payer: Self-pay

## 2021-07-11 ENCOUNTER — Emergency Department (HOSPITAL_COMMUNITY): Payer: BC Managed Care – PPO

## 2021-07-11 ENCOUNTER — Observation Stay (HOSPITAL_COMMUNITY)
Admission: EM | Admit: 2021-07-11 | Discharge: 2021-07-13 | Disposition: A | Discharge status: Home / Self Care | Payer: BC Managed Care – PPO | Attending: Neurosurgery | Admitting: Neurosurgery

## 2021-07-11 ENCOUNTER — Encounter (HOSPITAL_COMMUNITY): Payer: Self-pay

## 2021-07-11 DIAGNOSIS — Z79899 Other long term (current) drug therapy: Secondary | ICD-10-CM | POA: Insufficient documentation

## 2021-07-11 DIAGNOSIS — S32018A Other fracture of first lumbar vertebra, initial encounter for closed fracture: Secondary | ICD-10-CM | POA: Diagnosis not present

## 2021-07-11 DIAGNOSIS — Z91040 Latex allergy status: Secondary | ICD-10-CM | POA: Insufficient documentation

## 2021-07-11 DIAGNOSIS — M545 Low back pain, unspecified: Secondary | ICD-10-CM | POA: Diagnosis present

## 2021-07-11 DIAGNOSIS — S32019A Unspecified fracture of first lumbar vertebra, initial encounter for closed fracture: Secondary | ICD-10-CM | POA: Diagnosis present

## 2021-07-11 DIAGNOSIS — Y9352 Activity, horseback riding: Secondary | ICD-10-CM | POA: Insufficient documentation

## 2021-07-11 MED ORDER — SODIUM CHLORIDE 0.9% FLUSH: 3.0000 mL | INTRAVENOUS | Status: DC | PRN

## 2021-07-11 MED ORDER — ATORVASTATIN CALCIUM 10 MG PO TABS
20.0000 mg | ORAL_TABLET | Freq: Every day | ORAL | Status: DC
  Administered 2021-07-12 – 2021-07-13 (×2): 20 mg via ORAL
  Filled 2021-07-11 (×2): qty 2

## 2021-07-11 MED ORDER — SODIUM CHLORIDE 0.9% FLUSH: 3.0000 mL | Freq: Two times a day (BID) | INTRAVENOUS | Status: DC

## 2021-07-11 MED ORDER — OXYCODONE HCL 5 MG PO TABS
10.0000 mg | ORAL_TABLET | ORAL | Status: DC | PRN
  Administered 2021-07-11 – 2021-07-13 (×5): 10 mg via ORAL
  Filled 2021-07-11 (×5): qty 2

## 2021-07-11 MED ORDER — METOPROLOL SUCCINATE ER 25 MG PO TB24
25.0000 mg | ORAL_TABLET | Freq: Every day | ORAL | Status: DC
  Administered 2021-07-13: 25 mg via ORAL
  Filled 2021-07-11 (×2): qty 1

## 2021-07-11 MED ORDER — METHOCARBAMOL 500 MG PO TABS
500.0000 mg | ORAL_TABLET | Freq: Four times a day (QID) | ORAL | Status: DC | PRN
  Administered 2021-07-13 (×2): 500 mg via ORAL
  Filled 2021-07-11 (×2): qty 1

## 2021-07-11 MED ORDER — ONDANSETRON 4 MG PO TBDP
8.0000 mg | ORAL_TABLET | Freq: Once | ORAL | Status: AC
  Administered 2021-07-11: 8 mg via ORAL
  Filled 2021-07-11: qty 2

## 2021-07-11 MED ORDER — VITAMIN D 25 MCG (1000 UNIT) PO TABS
5000.0000 [IU] | ORAL_TABLET | Freq: Every day | ORAL | Status: DC
  Administered 2021-07-12 – 2021-07-13 (×2): 5000 [IU] via ORAL
  Filled 2021-07-11 (×2): qty 5

## 2021-07-11 MED ORDER — ONDANSETRON HCL 4 MG/2ML IJ SOLN
4.0000 mg | Freq: Four times a day (QID) | INTRAMUSCULAR | Status: DC | PRN
Start: 2021-07-11 — End: 2021-07-13
  Filled 2021-07-11: qty 2

## 2021-07-11 MED ORDER — METHOCARBAMOL 1000 MG/10ML IJ SOLN: 500.0000 mg | Freq: Four times a day (QID) | INTRAVENOUS | Status: DC | PRN

## 2021-07-11 MED ORDER — ONDANSETRON HCL 4 MG PO TABS
4.0000 mg | ORAL_TABLET | Freq: Four times a day (QID) | ORAL | Status: DC | PRN
  Filled 2021-07-11: qty 1

## 2021-07-11 MED ORDER — SODIUM CHLORIDE 0.9 % IV SOLN: 250.0000 mL | INTRAVENOUS | Status: DC

## 2021-07-11 MED ORDER — ACETAMINOPHEN 650 MG RE SUPP: 650.0000 mg | RECTAL | Status: DC | PRN

## 2021-07-11 MED ORDER — HYDROCODONE-ACETAMINOPHEN 5-325 MG PO TABS: 1.0000 | ORAL_TABLET | ORAL | Status: DC | PRN

## 2021-07-11 MED ORDER — OXYCODONE-ACETAMINOPHEN 5-325 MG PO TABS
1.0000 | ORAL_TABLET | Freq: Once | ORAL | Status: AC
  Administered 2021-07-11: 1 via ORAL
  Filled 2021-07-11: qty 1

## 2021-07-11 MED ORDER — ACETAMINOPHEN 325 MG PO TABS: 650.0000 mg | ORAL_TABLET | ORAL | Status: DC | PRN

## 2021-07-11 NOTE — ED Notes (Signed)
Pt back from CT

## 2021-07-11 NOTE — ED Triage Notes (Signed)
Pt BIB [facility] from PCP office. Per EMS, pt fell off the horse, imaging from PCP shown L1 fx. Pt had previously fractured her L2. VSS. Pt states he's not in pain at this moment.

## 2021-07-11 NOTE — H&P (Signed)
Christina Beltran is an 61 y.o. female.   HPI:  61 year old female presented to the ED tonight after falling off of her horse. States that she fell on her buttocks and then on her back. Her pain right now is 5/10 but worse if she moves. Denies any pain NTW down her legs. Has a history of multiple spine fractures in the past from falling off of horses.   Past Medical History:  Diagnosis Date   Hyperlipidemia     Past Surgical History:  Procedure Laterality Date   ABDOMINOPLASTY  1990   BLADDER SUSPENSION  2000   CESAREAN SECTION  1988   compound fracture  1978   R arm x3 procedures    RHINOPLASTY  1980    Allergies  Allergen Reactions   Latex     Social History   Tobacco Use   Smoking status: Never   Smokeless tobacco: Never  Substance Use Topics   Alcohol use: Yes    Alcohol/week: 5.0 standard drinks    Types: 5 Glasses of wine per week    Comment: occa. weekly.     Family History  Problem Relation Age of Onset   Breast cancer Mother    Hypertension Mother    Hyperlipidemia Mother    Breast cancer Maternal Grandmother      Review of Systems  Positive ROS: as above  All other systems have been reviewed and were otherwise negative with the exception of those mentioned in the HPI and as above.  Objective: Vital signs in last 24 hours: Temp:  [97.9 F (36.6 C)] 97.9 F (36.6 C) (06/02 1755) Pulse Rate:  [53] 53 (06/02 1755) Resp:  [15] 15 (06/02 1755) BP: (108)/(73) 108/73 (06/02 1755) SpO2:  [94 %] 94 % (06/02 1755) Weight:  [80.7 kg] 80.7 kg (06/02 1803)  General Appearance: Alert, cooperative, no distress, appears stated age Head: Normocephalic, without obvious abnormality, atraumatic Eyes: PERRL, conjunctiva/corneas clear, EOM's intact, fundi benign, both eyes      Back: Symmetric, no curvature, ROM normal, no CVA tenderness Lungs: respirations unlabored Heart: Regular rate and rhythm Pulses: 2+ and symmetric all extremities Skin: Skin color, texture,  turgor normal, no rashes or lesions  NEUROLOGIC:   Mental status: A&O x4, no aphasia, good attention span, Memory and fund of knowledge Motor Exam - grossly normal, normal tone and bulk Sensory Exam - grossly normal Reflexes: symmetric, no pathologic reflexes, No Hoffman's, No clonus Coordination - grossly normal Gait -not tested Balance - not tested Cranial Nerves: I: smell Not tested  II: visual acuity  OS: na    OD: na  II: visual fields Full to confrontation  II: pupils Equal, round, reactive to light  III,VII: ptosis None  III,IV,VI: extraocular muscles  Full ROM  V: mastication   V: facial light touch sensation    V,VII: corneal reflex    VII: facial muscle function - upper    VII: facial muscle function - lower   VIII: hearing   IX: soft palate elevation    IX,X: gag reflex   XI: trapezius strength    XI: sternocleidomastoid strength   XI: neck flexion strength    XII: tongue strength      Data Review No results found for: WBC, HGB, HCT, MCV, PLT Lab Results  Component Value Date   NA 143 01/10/2020   K 4.5 01/10/2020   CL 108 (H) 01/10/2020   CO2 23 01/10/2020   BUN 13 01/10/2020   CREATININE 0.96  01/10/2020   GLUCOSE 94 01/10/2020   No results found for: INR, PROTIME  Radiology: CT Lumbar Spine Wo Contrast  Result Date: 07/11/2021 CLINICAL DATA:  Compression fracture, lumbar EXAM: CT LUMBAR SPINE WITHOUT CONTRAST TECHNIQUE: Multidetector CT imaging of the lumbar spine was performed without intravenous contrast administration. Multiplanar CT image reconstructions were also generated. RADIATION DOSE REDUCTION: This exam was performed according to the departmental dose-optimization program which includes automated exposure control, adjustment of the mA and/or kV according to patient size and/or use of iterative reconstruction technique. COMPARISON:  Lumbar radiographs 08/11/2016. FINDINGS: Segmentation: Transitional lumbosacral anatomy with 5 non rib-bearing  lumbar vertebral bodies and partially lumbarized S1 vertebral body. Alignment: Slight, grade 1 anterolisthesis of L4 on L5, favor degenerative given facet arthropathy. No substantial sagittal subluxation. L1 superior endplate bony retropulsion detailed below. Vertebrae: Acute L1 superior endplate fracture with approximately 40% height loss and 4 mm of bony retropulsion. Fracture extends posteriorly through the T12 lamina and T12 spinous process. L2 compression fracture with similar height loss in comparison to the prior radiographs from 2018. Paraspinal and other soft tissues: Calcific atherosclerosis of the aorta. Disc levels: Lower lumbar facet arthropathy. No high-grade bony canal or foraminal stenosis. IMPRESSION: 1. 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 Correlation with radiographs is recommended prior to any operative intervention. 2. Acute L1 superior endplate fracture with approximately 40% height loss and 4 mm of bony retropulsion. Fracture extends posteriorly through the T12 lamina and T12 spinous process. Probably mild resulting canal stenosis. An MRI could further characterize if clinically warranted. 3. Probably remote L2 compression fracture given similar height loss on the prior radiographs. Electronically Signed   By: Feliberto Harts M.D.   On: 07/11/2021 18:44     Assessment/Plan: 61 year old female presented to the ED tonight after falling off of horse and landing on her buttocks. CT lumbar shows a compression fracture of L1 with 40% vertebral height loss and some retropulsion into the canal, lamina and spinous process fractures of T12. We will plan to get an MRI lumbar spine to look for ligamentous injury as it appears to be a chance type fracture. Fit for clamshell brace and likely upright xrays tomorrow with brace on. No surgical intervention warranted at this time.    Tiana Loft [facility] 07/11/2021  8:44 PM

## 2021-07-11 NOTE — ED Notes (Signed)
Patient transported to MRI 

## 2021-07-11 NOTE — ED Provider Notes (Signed)
[facility]   CSN: [id number] Arrival date & time: 07/11/21  1754     History  Chief Complaint  Patient presents with   Fall   Trauma    Craig StaggersDanya Jason Beltran is a 61 y.o. female.   Fall  Trauma Mechanism of injury: Fall   Patient with medical history of hyperlipidemia, previous C2 fracture presents today due to fall off horse.  Patient states she was riding her horse when she fell on compacted dirt.  She landed on her back/bottom, did not hit her head or lose consciousness.  She denies losing any control of her bladder or bowels, denies any numbness or tingling in the lower extremities.  She primarily has pain in the lower back which does not radiate and is worse with any movement.  Has not had anything for pain yet, pain is about a 5 out of 10.  No previous spinal surgeries, no chest pain, shortness of breath, abdominal pain, upper back pain, neck pain, headache, vision changes.  Home Medications Prior to Admission medications   Medication Sig Start Date End Date Taking? Authorizing Provider  atorvastatin (LIPITOR) 20 MG tablet TAKE 1 TABLET BY MOUTH EVERYDAY AT BEDTIME 08/26/20   Tolia, Sunit, DO  Cholecalciferol (D-3-5) 125 MCG (5000 UT) capsule Take 5,000 Units by mouth daily.    [provider]  metoprolol succinate (TOPROL-XL) 25 MG 24 hr tablet TAKE 1 TABLET BY MOUTH EVERY DAY IN THE MORNING 12/05/20   Tolia, Sunit, DO      Allergies    Latex    Review of Systems   Review of Systems  Physical Exam Updated Vital Signs BP 108/73 (BP Location: Right Arm)   Pulse (!) 53   Temp 97.9 F (36.6 C) (Oral)   Resp 15   Ht 5\' 5"  (1.651 m)   Wt 80.7 kg   SpO2 94%   BMI 29.61 kg/m  Physical Exam Vitals and nursing note reviewed. Exam conducted with a chaperone present.  Constitutional:      Appearance: Normal appearance.  HENT:     Head: Normocephalic and atraumatic.     Comments: No malocclusion, no periorbital  ecchymosis or battle sign. Eyes:     General: No scleral icterus.       Right eye: No discharge.        Left eye: No discharge.     Extraocular Movements: Extraocular movements intact.     Pupils: Pupils are equal, round, and reactive to light.  Cardiovascular:     Rate and Rhythm: Normal rate and regular rhythm.     Pulses: Normal pulses.     Heart sounds: Normal heart sounds. No murmur heard.   No friction rub. No gallop.  Pulmonary:     Effort: Pulmonary effort is normal. No respiratory distress.     Breath sounds: Normal breath sounds.  Abdominal:     General: Abdomen is flat. Bowel sounds are normal. There is no distension.     Palpations: Abdomen is soft.     Tenderness: There is no abdominal tenderness.  Musculoskeletal:        General: Tenderness present.     Cervical back: No tenderness.     Comments: Moves upper EXTR and lower extremities.  Reproducible midline tenderness around L4-L5 to the spine.  Skin:    General: Skin is warm and dry.     Coloration: Skin is not jaundiced.  Neurological:     Mental Status: She  is alert. Mental status is at baseline.     Coordination: Coordination normal.     Comments: Cranial nerves III through XII are grossly intact.  Grips equal bilaterally, patient is able to raise both lower extremities.  Dorsiflexion plantarflexion 5/5 against resistance.  Sensation light touch is grossly intact to the lower extremities bilaterally.    ED Results / Procedures / Treatments   Labs (all labs ordered are listed, but only abnormal results are displayed) Labs Reviewed - No data to display  EKG None  Radiology CT Lumbar Spine Wo Contrast  Result Date: 07/11/2021 CLINICAL DATA:  Compression fracture, lumbar EXAM: CT LUMBAR SPINE WITHOUT CONTRAST TECHNIQUE: Multidetector CT imaging of the lumbar spine was performed without intravenous contrast administration. Multiplanar CT image reconstructions were also generated. RADIATION DOSE REDUCTION: This  exam was performed according to the departmental dose-optimization program which includes automated exposure control, adjustment of the mA and/or kV according to patient size and/or use of iterative reconstruction technique. COMPARISON:  Lumbar radiographs 08/11/2016. 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 Alignment: Slight, grade 1 anterolisthesis of L4 on L5, favor degenerative given facet arthropathy. No substantial sagittal subluxation. L1 superior endplate bony retropulsion detailed below. Vertebrae: Acute L1 superior endplate fracture with approximately 40% height loss and 4 mm of bony retropulsion. Fracture extends posteriorly through the T12 lamina and T12 spinous process. L2 compression fracture with similar height loss in comparison to the prior radiographs from 2018. Paraspinal and other soft tissues: Calcific atherosclerosis of the aorta. Disc levels: Lower lumbar facet arthropathy. No high-grade bony canal or foraminal stenosis. IMPRESSION: 1. 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 Correlation with radiographs is recommended prior to any operative intervention. 2. Acute L1 superior endplate fracture with approximately 40% height loss and 4 mm of bony retropulsion. Fracture extends posteriorly through the T12 lamina and T12 spinous process. Probably mild resulting canal stenosis. An MRI could further characterize if clinically warranted. 3. Probably remote L2 compression fracture given similar height loss on the prior radiographs. Electronically Signed   By: Feliberto Harts M.D.   On: 07/11/2021 18:44    Procedures Procedures    Medications Ordered in ED Medications  oxyCODONE-acetaminophen (PERCOCET/ROXICET) 5-325 MG per tablet 1 tablet (has no administration in time range)  ondansetron (ZOFRAN-ODT)  disintegrating tablet 8 mg (has no administration in time range)    ED Course/ Medical Decision Making/ A&P                           Medical Decision Making Amount and/or Complexity of Data Reviewed Radiology: ordered.  Risk Prescription drug management. Decision regarding hospitalization.   Patient presents due to fall from horse.  She has lumbar tenderness, there are no focal deficits on neuro exam.  No red flag symptoms for cauda equina, vitals are stable.  She has not had any loss of bowel or bladder function, there are no sensation deficits or paresthesias.  I ordered and viewed CT lumbar spine.  Agree with radiologist interpretation.  There is signs of acute L1 superior endplate fracture with approximately 40% height loss, this extends posteriorly to the T12 lamina and spinous process.  Also probable remote L2 compression fracture.  I discussed this with the on-call neurosurgery FNP [facility].  They advised neurosurgery admission and MRI of the lumbar spine with plans for upright in  flat imaging of the upper back.  I appreciate their consult help coordinating care for this patient.  I spoke with MRI tech regarding the plate in her right upper extremity, there does not appear to be an issue with MRI compatibility.    On reevaluation patient is not any pain, the Percocet and Zofran helped.        Final Clinical Impression(s) / ED Diagnoses Final diagnoses:  None    Rx / DC Orders ED Discharge Orders     None         Theron Arista, Cordelia Poche 07/11/21 2104    Tegeler, Canary Brim, MD 07/11/21 2104

## 2021-07-11 NOTE — ED Notes (Signed)
Pt reports she fell off her horse which is approximately 6 ft and landed "almost in a sitting position".

## 2021-07-12 ENCOUNTER — Observation Stay (HOSPITAL_COMMUNITY): Payer: BC Managed Care – PPO

## 2021-07-12 MED ORDER — ONDANSETRON 4 MG PO TBDP
4.0000 mg | ORAL_TABLET | Freq: Four times a day (QID) | ORAL | Status: DC | PRN
  Filled 2021-07-12: qty 1

## 2021-07-12 NOTE — Plan of Care (Signed)

## 2021-07-12 NOTE — Evaluation (Signed)
Physical Therapy Evaluation and D/C Patient Details Name: Christina Beltran MRN: [id number] DOB: 1960-11-02 Today's Date: 07/12/2021  History of Present Illness  61 year old female admitted 6/2 with acute L1 compression fracture after falling from a horse.  MRI scan reveals compression fracture with interspinous ligament disruption and T12 spinous process fracture however does not appear to have ruptured the disc base or other signs of worsening instability.  Medical management per MD if pain controlled. PMH:  Previous L2 fracture  Clinical Impression  Pt admitted with above diagnosis. Pt was able to ambulate without device and no LOB.  Educated pt regarding back precautions as well as brace application and pt and husband comfortable with all education.  Will need 3N1 to ease pain with toileting. Husband can assist pt at home. All education completed. Does not need f/u PT until back heals.    Recommendations for follow up therapy are one component of a multi-disciplinary discharge planning process, led by the attending physician.  Recommendations may be updated based on patient status, additional functional criteria and insurance authorization.  Follow Up Recommendations No PT follow up    Assistance Recommended at Discharge PRN  Patient can return home with the following  A little help with walking and/or transfers    Equipment Recommendations BSC/3in1  Recommendations for Other Services       Functional Status Assessment Patient has had a recent decline in their functional status and demonstrates the ability to make significant improvements in function in a reasonable and predictable amount of time.     Precautions / Restrictions Precautions Precautions: Fall;Back Precaution Booklet Issued: Yes (comment) Required Braces or Orthoses: Spinal Brace Spinal Brace: Applied in sitting position;Thoracolumbosacral orthotic Restrictions Weight Bearing Restrictions: No      Mobility  Bed  Mobility Overal bed mobility: Needs Assistance Bed Mobility: Rolling, Sidelying to Sit Rolling: Min guard Sidelying to sit: Min guard       General bed mobility comments: Pt needed cues for log roll but could sit up on her own with out assist.    Transfers Overall transfer level: Needs assistance Equipment used: None, 1 person hand held assist Transfers: Sit to/from Stand Sit to Stand: Min assist           General transfer comment: Husband helped pt up with pt pulling up on husbands hand the first time. However pt was able to stand from recliner without any assist.    Ambulation/Gait Ambulation/Gait assistance: Supervision, Min guard Gait Distance (Feet): 110 Feet Assistive device: None Gait Pattern/deviations: Step-through pattern, Decreased stride length   Gait velocity interpretation: <1.8 ft/sec, indicate of risk for recurrent falls   General Gait Details: Pt was able to ambulate without assist. No LOB.  Pt did report some spasms and limite distance due to that.  Stairs            Wheelchair Mobility    Modified Rankin (Stroke Patients Only)       Balance Overall balance assessment: Needs assistance Sitting-balance support: No upper extremity supported, Feet supported Sitting balance-Leahy Scale: Fair     Standing balance support: No upper extremity supported, During functional activity Standing balance-Leahy Scale: Fair Standing balance comment: can stand with min guard assist with armrests on chair                             Pertinent Vitals/Pain Pain Assessment Pain Assessment: Faces Faces Pain Scale: Hurts whole lot Pain Location: low back  Pain Descriptors / Indicators: Aching Pain Intervention(s): Limited activity within patient's tolerance, Monitored during session, Premedicated before session, Repositioned    Home Living Family/patient expects to be discharged to:: Private residence Living Arrangements: Spouse/significant  other Available Help at Discharge: Family;Available 24 hours/day (work from home) Type of Home: House Home Access: Level entry       Home Layout: One level Home Equipment: None      Prior Function Prior Level of Function : Independent/Modified Independent                     Hand Dominance   Dominant Hand: Right    Extremity/Trunk Assessment   Upper Extremity Assessment Upper Extremity Assessment: Defer to OT evaluation    Lower Extremity Assessment Lower Extremity Assessment: Overall WFL for tasks assessed    Cervical / Trunk Assessment Cervical / Trunk Assessment: Other exceptions  Communication   Communication: No difficulties  Cognition Arousal/Alertness: Awake/alert Behavior During Therapy: WFL for tasks assessed/performed Overall Cognitive Status: Within Functional Limits for tasks assessed                                          General Comments General comments (skin integrity, edema, etc.): Educated pt and husband in back precautions, brace application and issued gait belt.    Exercises     Assessment/Plan    PT Assessment Patient does not need any further PT services  PT Problem List         PT Treatment Interventions      PT Goals (Current goals can be found in the Care Plan section)  Acute Rehab PT Goals Patient Stated Goal: to go home PT Goal Formulation: All assessment and education complete, DC therapy    Frequency       Co-evaluation               AM-PAC PT "6 Clicks" Mobility  Outcome Measure Help needed turning from your back to your side while in a flat bed without using bedrails?: A Little Help needed moving from lying on your back to sitting on the side of a flat bed without using bedrails?: A Little Help needed moving to and from a bed to a chair (including a wheelchair)?: A Little Help needed standing up from a chair using your arms (e.g., wheelchair or bedside chair)?: A Little Help needed to  walk in hospital room?: A Little Help needed climbing 3-5 steps with a railing? : A Little 6 Click Score: 18    End of Session Equipment Utilized During Treatment: Gait belt Activity Tolerance: Patient tolerated treatment well Patient left: in chair;with call bell/phone within reach;with family/visitor present Nurse Communication: Mobility status PT Visit Diagnosis: Muscle weakness (generalized) (M62.81)    Time: 1227-1300 PT Time Calculation (min) (ACUTE ONLY): 33 min   Charges:   PT Evaluation $PT Eval Moderate Complexity: 1 Mod PT Treatments $Gait Training: 8-22 mins        Cristyn Crossno M,PT Acute Rehab Services [phone] (pager)   Bevelyn Buckles 07/12/2021, 1:24 PM

## 2021-07-12 NOTE — ED Notes (Signed)
Meyran, NP notified about pt requesting MRI results. Pt also not wanting an IV unless absolutely necessarily. Pt's pain is being managed well with PO meds.

## 2021-07-12 NOTE — Progress Notes (Signed)
Pt to 4N09 from ED in wheelchair, pt ambulated to bed independently, stated pain of 3/10, A&O x4, VSS, full assessment charted, left with bed in lowest position, call bell in reach, all current needs met

## 2021-07-12 NOTE — Progress Notes (Signed)
Orthopedic Tech Progress Note Patient Details:  Christina Beltran Dec 02, 1960 [id number]  Patient ID: Christina Beltran, female   DOB: September 24, 1960, 61 y.o.   MRN: [id number] Brace ordered. Christina Beltran 07/12/2021, 12:25 AM

## 2021-07-12 NOTE — ED Notes (Signed)
OT called for outside vendor brace.

## 2021-07-12 NOTE — Progress Notes (Signed)
Subjective: Patient reports  condition of back pain denies any leg pain  Objective: Vital signs in last 24 hours: Temp:  [97.8 F (36.6 C)-98.1 F (36.7 C)] 97.8 F (36.6 C) (06/03 0712) Pulse Rate:  [51-59] 57 (06/03 0712) Resp:  [15-18] 16 (06/03 0712) BP: (101-115)/(59-76) 101/60 (06/03 0712) SpO2:  [94 %-98 %] 96 % (06/03 0712) Weight:  [80.7 kg] 80.7 kg (06/02 1803)  Intake/Output from previous day: No intake/output data recorded. Intake/Output this shift: No intake/output data recorded.  Awake and alert strength 5 out of 5 lower extremities bilaterally brace on and in place  Lab Results: No results for input(s): WBC, HGB, HCT, PLT in the last 72 hours. BMET No results for input(s): NA, K, CL, CO2, GLUCOSE, BUN, CREATININE, CALCIUM in the last 72 hours.  Studies/Results: CT Lumbar Spine Wo Contrast  Result Date: 07/11/2021 CLINICAL DATA:  Compression fracture, lumbar EXAM: CT LUMBAR SPINE WITHOUT CONTRAST TECHNIQUE: Multidetector CT imaging of the lumbar spine was performed without intravenous contrast administration. Multiplanar CT image reconstructions were also generated. RADIATION DOSE REDUCTION: This exam was performed according to the departmental dose-optimization program which includes automated exposure control, adjustment of the mA and/or kV according to patient size and/or use of iterative reconstruction technique. COMPARISON:  Lumbar radiographs 08/11/2016. 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 Alignment: Slight, grade 1 anterolisthesis of L4 on L5, favor degenerative given facet arthropathy. No substantial sagittal subluxation. L1 superior endplate bony retropulsion detailed below. Vertebrae: Acute L1 superior endplate fracture with approximately 40% height loss and 4 mm of bony retropulsion. Fracture extends posteriorly through the T12 lamina and T12 spinous  process. L2 compression fracture with similar height loss in comparison to the prior radiographs from 2018. Paraspinal and other soft tissues: Calcific atherosclerosis of the aorta. Disc levels: Lower lumbar facet arthropathy. No high-grade bony canal or foraminal stenosis. IMPRESSION: 1. 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 Correlation with radiographs is recommended prior to any operative intervention. 2. Acute L1 superior endplate fracture with approximately 40% height loss and 4 mm of bony retropulsion. Fracture extends posteriorly through the T12 lamina and T12 spinous process. Probably mild resulting canal stenosis. An MRI could further characterize if clinically warranted. 3. Probably remote L2 compression fracture given similar height loss on the prior radiographs. Electronically Signed   By: Feliberto Harts M.D.   On: 07/11/2021 18:44   MR LUMBAR SPINE WO CONTRAST  Result Date: 07/11/2021 CLINICAL DATA:  Initial evaluation for acute compression fracture. EXAM: MRI LUMBAR SPINE WITHOUT CONTRAST TECHNIQUE: Multiplanar, multisequence MR imaging of the lumbar spine was performed. No intravenous contrast was administered. COMPARISON:  Prior CT from earlier the same day. FINDINGS: Segmentation: Standard. Lowest well-formed disc space labeled the L5-S1 level. Alignment: 4 mm anterolisthesis of L4 on L5, chronic and facet mediated. Underlying mild levoscoliosis. Vertebrae: Acute compression fracture involving the L1 vertebral body again seen. Associated height loss measures up to approximate 40% with 4 mm bony retropulsion. Posterior extension to involve the lamina and spinous process of T12 again noted. Small amount of associated epidural hemorrhage present within the ventral epidural space at the level of L1. No more than mild spinal stenosis without cord compression. Chronic L2 compression fracture with associated  endplate Schmorl's node deformity again noted. No other acute or chronic fracture. Underlying bone marrow signal intensity diffusely heterogeneous with multiple scattered benign hemangiomata. No worrisome  osseous lesions. Conus medullaris and cauda equina: Conus extends to the L1 level. Conus and cauda equina appear normal. Paraspinal and other soft tissues: Mild paraspinous edema adjacent to the L1 compression fracture. Mild cholelithiasis. Disc levels: T12-L1: 4 mm bony retropulsion related to the acute L1 compression fracture with minimal disc bulge. Mild facet hypertrophy, greater on the right. Resultant mild spinal stenosis. Foramina remain patent. L1-2: No significant disc bulge. Trace epidural hemorrhage within the ventral epidural space. Mild facet hypertrophy. No canal or foraminal stenosis. L2-3:  Negative interspace.  Mild facet hypertrophy.  No stenosis. L3-4:  Negative interspace.  Mild facet hypertrophy.  No stenosis. L4-5: 4 mm anterolisthesis. Disc desiccation with mild diffuse disc bulge, asymmetric to the right. Moderate facet hypertrophy with associated small joint effusions. Associated 9 mm synovial cyst at the inferior 0 medial aspect of the left L4-5 facet (series 8, image 29). Resultant mild right with moderate to severe left lateral recess stenosis, potentially affecting the descending left L5 nerve root. Central canal remains patent. No significant foraminal stenosis. L5-S1: Disc desiccation with minimal bulge. Moderate left with mild right facet arthrosis. No spinal stenosis. Foramina remain patent. IMPRESSION: 1. Acute compression fracture involving the L1 vertebral body with up to 40% height loss and 4 mm bony retropulsion. Resultant mild spinal stenosis without cord compression. Posterior extension through the lamina and spinous process of T12, better seen on prior CT. 2. Multifactorial degenerative changes at L4-5 facet, resulting in moderate to severe left lateral recess stenosis,  potentially affecting the descending left L5 nerve root. 3. Chronic L2 compression fracture. 4. Mild cholelithiasis. Electronically Signed   By: Rise Mu M.D.   On: 07/11/2021 23:54    Assessment/Plan: 61 year old female admitted with acute L1 compression fracture after falling from a horse.  MRI scan looks like compression fracture with interspinous ligament disruption and T12 spinous process fracture however does not appear to have ruptured the disc base or other signs of worsening instability.  We will trial her upright in a brace if the film is stable and her pain is manageable we will discharge her with scheduled follow-up.  LOS: 0 days     Mariam Dollar 07/12/2021, 8:27 AM

## 2021-07-12 NOTE — Progress Notes (Signed)
  Transition of Care [facility]) Screening Note   Patient Details  Name: Christina Beltran Date of Birth: [birth year]   Transition of Care [facility]) CM/SW Contact:    Windle Guard, LCSW Phone Number: 07/12/2021, 8:36 AM    Transition of Care Department [facility]) has reviewed patient and noted no immediate TOC needs pending continued medical work-up. TOC team will continue to monitor patient advancement through interdisciplinary progression rounds to support any identified discharge supports as needed. If new patient transition needs arise, please place a TOC consult or reach out to [facility] team.

## 2021-07-13 MED ORDER — OXYCODONE-ACETAMINOPHEN 5-325 MG PO TABS: 1.0000 | ORAL_TABLET | ORAL | 0 refills | Status: AC | PRN

## 2021-07-13 MED ORDER — METHOCARBAMOL 750 MG PO TABS: 750.0000 mg | ORAL_TABLET | Freq: Four times a day (QID) | ORAL | 0 refills | Status: DC

## 2021-07-13 NOTE — Plan of Care (Signed)

## 2021-07-13 NOTE — Discharge Summary (Signed)
Physician Discharge Summary  Patient ID: Christina Beltran MRN: [id number] DOB/AGE: 1960/07/18 61 y.o.  Admit date: 07/11/2021 Discharge date: 07/13/2021  Admission Diagnoses: L1 fracture   Discharge Diagnoses: same   Discharged Condition: good  Hospital Course: The patient was admitted on 07/11/2021 for L1 fracture and T12 posterior element fracture. Patient was fitted for a clamshell brace. The hospital course was routine. There were no complications. The wound remained clean dry and intact. Pt had appropriate back soreness. The patient remained afebrile with stable vital signs, and tolerated a regular diet. The patient continued to increase activities, and pain was well controlled with oral pain medications.   Consults: None  Significant Diagnostic Studies:  Results for orders placed or performed in visit on 11/30/19  CMP14+EGFR  Result Value Ref Range   Glucose 94 65 - 99 mg/dL   BUN 13 8 - 27 mg/dL   Creatinine, Ser 0.96 0.57 - 1.00 mg/dL   GFR calc non Af Amer 64 >59 mL/min/1.73   GFR calc Af Amer 74 >59 mL/min/1.73   BUN/Creatinine Ratio 14 12 - 28   Sodium 143 134 - 144 mmol/L   Potassium 4.5 3.5 - 5.2 mmol/L   Chloride 108 (H) 96 - 106 mmol/L   CO2 23 20 - 29 mmol/L   Calcium 9.8 8.7 - 10.3 mg/dL   Total Protein 6.6 6.0 - 8.5 g/dL   Albumin 4.6 3.8 - 4.9 g/dL   Globulin, Total 2.0 1.5 - 4.5 g/dL   Albumin/Globulin Ratio 2.3 (H) 1.2 - 2.2   Bilirubin Total 1.1 0.0 - 1.2 mg/dL   Alkaline Phosphatase 91 44 - 121 IU/L   AST 16 0 - 40 IU/L   ALT 19 0 - 32 IU/L  Lipid Panel With LDL/HDL Ratio  Result Value Ref Range   Cholesterol, Total 162 100 - 199 mg/dL   Triglycerides 101 0 - 149 mg/dL   HDL 51 >39 mg/dL   VLDL Cholesterol Cal 18 5 - 40 mg/dL   LDL Chol Calc (NIH) 93 0 - 99 mg/dL   LDL/HDL Ratio 1.8 0.0 - 3.2 ratio    CT Lumbar Spine Wo Contrast  Result Date: 07/11/2021 CLINICAL DATA:  Compression fracture, lumbar EXAM: CT LUMBAR SPINE WITHOUT CONTRAST TECHNIQUE:  Multidetector CT imaging of the lumbar spine was performed without intravenous contrast administration. Multiplanar CT image reconstructions were also generated. RADIATION DOSE REDUCTION: This exam was performed according to the departmental dose-optimization program which includes automated exposure control, adjustment of the mA and/or kV according to patient size and/or use of iterative reconstruction technique. COMPARISON:  Lumbar radiographs 08/11/2016. 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 Alignment: Slight, grade 1 anterolisthesis of L4 on L5, favor degenerative given facet arthropathy. No substantial sagittal subluxation. L1 superior endplate bony retropulsion detailed below. Vertebrae: Acute L1 superior endplate fracture with approximately 40% height loss and 4 mm of bony retropulsion. Fracture extends posteriorly through the T12 lamina and T12 spinous process. L2 compression fracture with similar height loss in comparison to the prior radiographs from 2018. Paraspinal and other soft tissues: Calcific atherosclerosis of the aorta. Disc levels: Lower lumbar facet arthropathy. No high-grade bony canal or foraminal stenosis. IMPRESSION: 1. 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 Correlation with radiographs is recommended prior to any operative intervention. 2. Acute L1 superior endplate fracture with approximately 40% height loss and 4 mm of bony retropulsion.  Fracture extends posteriorly through the T12 lamina and T12 spinous process. Probably mild resulting canal stenosis. An MRI could further characterize if clinically warranted. 3. Probably remote L2 compression fracture given similar height loss on the prior radiographs. Electronically Signed   By: Margaretha Sheffield M.D.   On: 07/11/2021 18:44   MR LUMBAR SPINE  WO CONTRAST  Result Date: 07/11/2021 CLINICAL DATA:  Initial evaluation for acute compression fracture. EXAM: MRI LUMBAR SPINE WITHOUT CONTRAST TECHNIQUE: Multiplanar, multisequence MR imaging of the lumbar spine was performed. No intravenous contrast was administered. COMPARISON:  Prior CT from earlier the same day. FINDINGS: Segmentation: Standard. Lowest well-formed disc space labeled the L5-S1 level. Alignment: 4 mm anterolisthesis of L4 on L5, chronic and facet mediated. Underlying mild levoscoliosis. Vertebrae: Acute compression fracture involving the L1 vertebral body again seen. Associated height loss measures up to approximate 40% with 4 mm bony retropulsion. Posterior extension to involve the lamina and spinous process of T12 again noted. Small amount of associated epidural hemorrhage present within the ventral epidural space at the level of L1. No more than mild spinal stenosis without cord compression. Chronic L2 compression fracture with associated endplate Schmorl's node deformity again noted. No other acute or chronic fracture. Underlying bone marrow signal intensity diffusely heterogeneous with multiple scattered benign hemangiomata. No worrisome osseous lesions. Conus medullaris and cauda equina: Conus extends to the L1 level. Conus and cauda equina appear normal. Paraspinal and other soft tissues: Mild paraspinous edema adjacent to the L1 compression fracture. Mild cholelithiasis. Disc levels: T12-L1: 4 mm bony retropulsion related to the acute L1 compression fracture with minimal disc bulge. Mild facet hypertrophy, greater on the right. Resultant mild spinal stenosis. Foramina remain patent. L1-2: No significant disc bulge. Trace epidural hemorrhage within the ventral epidural space. Mild facet hypertrophy. No canal or foraminal stenosis. L2-3:  Negative interspace.  Mild facet hypertrophy.  No stenosis. L3-4:  Negative interspace.  Mild facet hypertrophy.  No stenosis. L4-5: 4 mm  anterolisthesis. Disc desiccation with mild diffuse disc bulge, asymmetric to the right. Moderate facet hypertrophy with associated small joint effusions. Associated 9 mm synovial cyst at the inferior 0 medial aspect of the left L4-5 facet (series 8, image 29). Resultant mild right with moderate to severe left lateral recess stenosis, potentially affecting the descending left L5 nerve root. Central canal remains patent. No significant foraminal stenosis. L5-S1: Disc desiccation with minimal bulge. Moderate left with mild right facet arthrosis. No spinal stenosis. Foramina remain patent. IMPRESSION: 1. Acute compression fracture involving the L1 vertebral body with up to 40% height loss and 4 mm bony retropulsion. Resultant mild spinal stenosis without cord compression. Posterior extension through the lamina and spinous process of T12, better seen on prior CT. 2. Multifactorial degenerative changes at L4-5 facet, resulting in moderate to severe left lateral recess stenosis, potentially affecting the descending left L5 nerve root. 3. Chronic L2 compression fracture. 4. Mild cholelithiasis. Electronically Signed   By: Jeannine Boga M.D.   On: 07/11/2021 23:54   DG Lumbar Spine 1Vclearing  Result Date: 07/12/2021 CLINICAL DATA:  Lumbar spine compression fractures. EXAM: LUMBAR SPINE - 1 VIEW COMPARISON:  CT on 07/11/2021 FINDINGS: Acute wedge compression fracture of the L1 vertebral body is again seen with proximally 50% loss of vertebral body height. A chronic mild wedge compression fracture of the L2 vertebral body is again noted. Mild degenerative disc disease is seen at L4-5. Bilateral facet DJD is seen at L4-5 and L5-S1. Grade 1 anterolisthesis at L4-5 measures  approximately 5 mm, without significant change. IMPRESSION: No change in acute wedge compression fracture of L1 vertebral body. Chronic mild wedge compression deformity of L2 vertebral body. Stable degenerative spondylosis and grade 1  anterolisthesis at L4-5. Electronically Signed   By: Marlaine Hind M.D.   On: 07/12/2021 09:31    Antibiotics:  Anti-infectives (From admission, onward)    None       Discharge Exam: Blood pressure 121/75, pulse 60, temperature 98.4 F (36.9 C), temperature source Oral, resp. rate 13, height $RemoveBe'5\' 5"'TAnVeeAld$  (1.651 m), weight 80.7 kg, SpO2 95 %. Neurologic: Grossly normal Ambulating and voiding, pain controlled.   Discharge Medications:   Allergies as of 07/13/2021       Reactions   Latex         Medication List     TAKE these medications    atorvastatin 20 MG tablet Commonly known as: LIPITOR TAKE 1 TABLET BY MOUTH EVERYDAY AT BEDTIME What changed: See the new instructions.   D-3-5 125 MCG (5000 UT) capsule Generic drug: Cholecalciferol Take 5,000 Units by mouth daily.   methocarbamol 750 MG tablet Commonly known as: Robaxin-750 Take 1 tablet (750 mg total) by mouth 4 (four) times daily.   metoprolol succinate 25 MG 24 hr tablet Commonly known as: TOPROL-XL TAKE 1 TABLET BY MOUTH EVERY DAY IN THE MORNING What changed: See the new instructions.   oxyCODONE-acetaminophen 5-325 MG tablet Commonly known as: Percocet Take 1 tablet by mouth every 4 (four) hours as needed for severe pain.   phentermine 15 MG capsule Take 15 mg by mouth daily.   topiramate 25 MG tablet Commonly known as: TOPAMAX Take 25 mg by mouth 2 (two) times daily.        Disposition: home   Final Dx: L1 and T12 fracture  Discharge Instructions     Call MD for:  difficulty breathing, headache or visual disturbances   Complete by: As directed    Call MD for:  hives   Complete by: As directed    Call MD for:  persistant dizziness or light-headedness   Complete by: As directed    Call MD for:  persistant nausea and vomiting   Complete by: As directed    Call MD for:  redness, tenderness, or signs of infection (pain, swelling, redness, odor or green/yellow discharge around incision site)    Complete by: As directed    Call MD for:  severe uncontrolled pain   Complete by: As directed    Call MD for:  temperature >100.4   Complete by: As directed    Diet - low sodium heart healthy   Complete by: As directed    Driving Restrictions   Complete by: As directed    No driving for 2 weeks, no riding in the car for 1 week   Increase activity slowly   Complete by: As directed    Lifting restrictions   Complete by: As directed    No lifting more than 8 lbs          Signed: Ocie Cornfield Alora Gorey 07/13/2021, 8:43 AM

## 2021-09-30 ENCOUNTER — Other Ambulatory Visit: Payer: Self-pay | Admitting: Student

## 2021-09-30 DIAGNOSIS — S32010A Wedge compression fracture of first lumbar vertebra, initial encounter for closed fracture: Secondary | ICD-10-CM

## 2021-10-17 ENCOUNTER — Ambulatory Visit
Admission: RE | Admit: 2021-10-17 | Discharge: 2021-10-17 | Disposition: A | Discharge status: Home / Self Care | Payer: BC Managed Care – PPO | Source: Ambulatory Visit | Attending: Student | Admitting: Student

## 2021-10-17 ENCOUNTER — Other Ambulatory Visit: Payer: BC Managed Care – PPO

## 2021-10-17 DIAGNOSIS — S32010A Wedge compression fracture of first lumbar vertebra, initial encounter for closed fracture: Secondary | ICD-10-CM

## 2021-11-14 ENCOUNTER — Other Ambulatory Visit: Payer: Self-pay | Admitting: Internal Medicine

## 2021-11-20 ENCOUNTER — Other Ambulatory Visit: Payer: Self-pay | Admitting: Internal Medicine

## 2021-11-20 DIAGNOSIS — Z8781 Personal history of (healed) traumatic fracture: Secondary | ICD-10-CM

## 2022-06-01 ENCOUNTER — Ambulatory Visit
Admission: RE | Admit: 2022-06-01 | Discharge: 2022-06-01 | Disposition: A | Discharge status: Home / Self Care | Payer: 59 | Source: Ambulatory Visit | Attending: Internal Medicine | Admitting: Internal Medicine

## 2022-06-01 ENCOUNTER — Other Ambulatory Visit: Payer: Self-pay | Admitting: Internal Medicine

## 2022-06-01 DIAGNOSIS — Z1231 Encounter for screening mammogram for malignant neoplasm of breast: Secondary | ICD-10-CM

## 2022-06-01 DIAGNOSIS — M810 Age-related osteoporosis without current pathological fracture: Secondary | ICD-10-CM | POA: Diagnosis not present

## 2022-06-01 DIAGNOSIS — Z8781 Personal history of (healed) traumatic fracture: Secondary | ICD-10-CM

## 2022-06-01 DIAGNOSIS — M85832 Other specified disorders of bone density and structure, left forearm: Secondary | ICD-10-CM | POA: Diagnosis not present

## 2022-06-01 DIAGNOSIS — Z780 Asymptomatic menopausal state: Secondary | ICD-10-CM | POA: Diagnosis not present

## 2022-06-04 DIAGNOSIS — M810 Age-related osteoporosis without current pathological fracture: Secondary | ICD-10-CM | POA: Diagnosis not present

## 2022-06-10 DIAGNOSIS — M810 Age-related osteoporosis without current pathological fracture: Secondary | ICD-10-CM | POA: Diagnosis not present

## 2022-10-19 DIAGNOSIS — Z79899 Other long term (current) drug therapy: Secondary | ICD-10-CM | POA: Diagnosis not present

## 2022-10-21 DIAGNOSIS — L578 Other skin changes due to chronic exposure to nonionizing radiation: Secondary | ICD-10-CM | POA: Diagnosis not present

## 2022-10-21 DIAGNOSIS — L821 Other seborrheic keratosis: Secondary | ICD-10-CM | POA: Diagnosis not present

## 2022-10-21 DIAGNOSIS — Z86018 Personal history of other benign neoplasm: Secondary | ICD-10-CM | POA: Diagnosis not present

## 2022-10-21 DIAGNOSIS — D225 Melanocytic nevi of trunk: Secondary | ICD-10-CM | POA: Diagnosis not present

## 2022-10-21 DIAGNOSIS — D2262 Melanocytic nevi of left upper limb, including shoulder: Secondary | ICD-10-CM | POA: Diagnosis not present

## 2022-10-21 DIAGNOSIS — D2272 Melanocytic nevi of left lower limb, including hip: Secondary | ICD-10-CM | POA: Diagnosis not present

## 2022-10-21 DIAGNOSIS — D2271 Melanocytic nevi of right lower limb, including hip: Secondary | ICD-10-CM | POA: Diagnosis not present

## 2022-10-21 DIAGNOSIS — D2261 Melanocytic nevi of right upper limb, including shoulder: Secondary | ICD-10-CM | POA: Diagnosis not present

## 2022-11-16 DIAGNOSIS — Z7184 Encounter for health counseling related to travel: Secondary | ICD-10-CM | POA: Diagnosis not present

## 2022-11-16 DIAGNOSIS — Z79899 Other long term (current) drug therapy: Secondary | ICD-10-CM | POA: Diagnosis not present

## 2022-11-16 DIAGNOSIS — Z0000 Encounter for general adult medical examination without abnormal findings: Secondary | ICD-10-CM | POA: Diagnosis not present

## 2022-11-16 DIAGNOSIS — E559 Vitamin D deficiency, unspecified: Secondary | ICD-10-CM | POA: Diagnosis not present

## 2022-11-16 DIAGNOSIS — I493 Ventricular premature depolarization: Secondary | ICD-10-CM | POA: Diagnosis not present

## 2022-11-16 DIAGNOSIS — T753XXA Motion sickness, initial encounter: Secondary | ICD-10-CM | POA: Diagnosis not present

## 2022-11-16 DIAGNOSIS — Z923 Personal history of irradiation: Secondary | ICD-10-CM | POA: Diagnosis not present

## 2022-11-16 DIAGNOSIS — E782 Mixed hyperlipidemia: Secondary | ICD-10-CM | POA: Diagnosis not present

## 2022-11-16 DIAGNOSIS — M810 Age-related osteoporosis without current pathological fracture: Secondary | ICD-10-CM | POA: Diagnosis not present

## 2022-11-16 DIAGNOSIS — Z23 Encounter for immunization: Secondary | ICD-10-CM | POA: Diagnosis not present

## 2022-11-16 DIAGNOSIS — Z5320 Procedure and treatment not carried out because of patient's decision for unspecified reasons: Secondary | ICD-10-CM | POA: Diagnosis not present

## 2022-11-16 DIAGNOSIS — E669 Obesity, unspecified: Secondary | ICD-10-CM | POA: Diagnosis not present

## 2022-12-29 IMAGING — CR DG LUMBAR SPINE 1V CLEARING
1 series · 1 of 1 positions shown · non-contrast
Comparison: CT on 07/11/2021

CLINICAL DATA: Lumbar spine compression fractures.

EXAM:
LUMBAR SPINE - 1 VIEW

[l-spine lat]
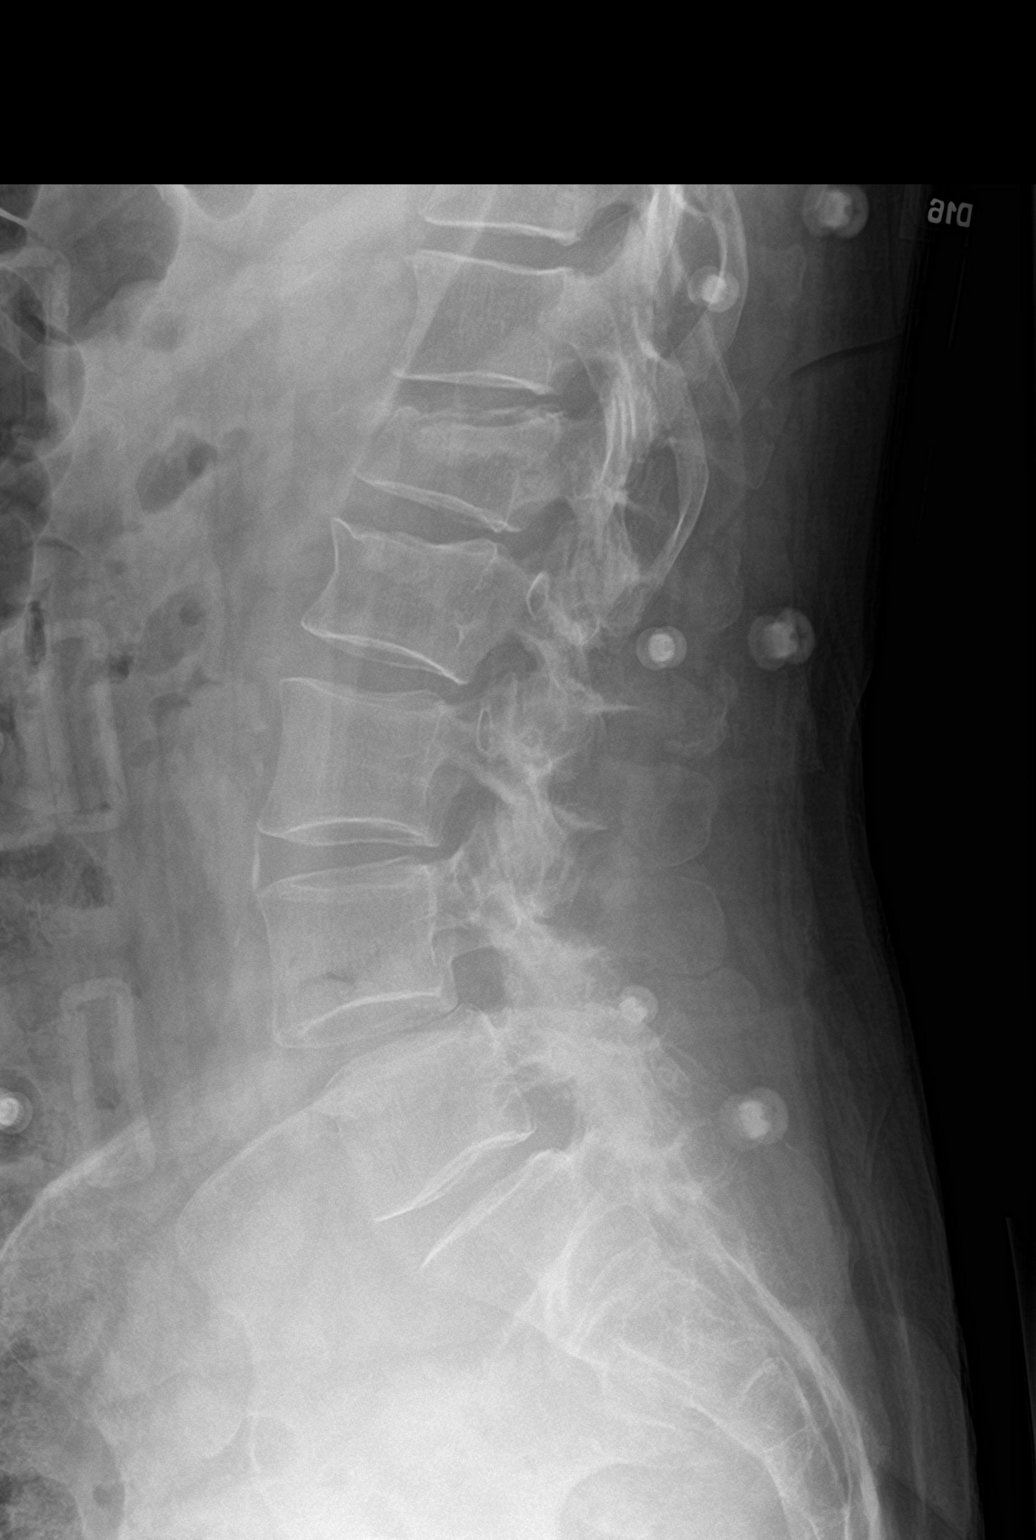

[1 of 1 positions shown; findings below may reference images not displayed]

FINDINGS: Acute wedge compression fracture of the L1 vertebral body is again
seen with proximally 50% loss of vertebral body height. A chronic
mild wedge compression fracture of the L2 vertebral body is again
noted.

Mild degenerative disc disease is seen at L4-5. Bilateral facet DJD
is seen at L4-5 and L5-S1. Grade 1 anterolisthesis at L4-5 measures
approximately 5 mm, without significant change.
IMPRESSION: No change in acute wedge compression fracture of L1 vertebral body.

Chronic mild wedge compression deformity of L2 vertebral body.

Stable degenerative spondylosis and grade 1 anterolisthesis at L4-5.

## 2023-01-27 DIAGNOSIS — M810 Age-related osteoporosis without current pathological fracture: Secondary | ICD-10-CM | POA: Diagnosis not present

## 2023-06-16 ENCOUNTER — Telehealth (HOSPITAL_BASED_OUTPATIENT_CLINIC_OR_DEPARTMENT_OTHER): Payer: Self-pay | Admitting: *Deleted

## 2023-06-16 NOTE — Telephone Encounter (Signed)
 Message sent to our scheduling team to reach out to the pt with a new pt appt.

## 2023-06-16 NOTE — Telephone Encounter (Signed)
   Name: Christina Beltran  DOB: 01/23/1961  MRN: [id number]  Primary Cardiologist: None Patient last seen in office in 01/2020.   Chart reviewed as part of pre-operative protocol coverage. Because of Christina Beltran's past medical history and time since last visit, she will require a follow-up in-office visit in order to better assess preoperative cardiovascular risk.   Pre-op covering staff: - Please schedule appointment and call patient to inform them. If patient already had an upcoming appointment within acceptable timeframe, please add "pre-op clearance" to the appointment notes so provider is aware. - Please contact requesting surgeon's office via preferred method (i.e, phone, fax) to inform them of need for appointment prior to surgery.  Lemmie Steinhaus D Kateland Leisinger, NP  06/16/2023, 4:17 PM

## 2023-06-16 NOTE — Telephone Encounter (Signed)
 Pt will need new pt appt last seen 2021.

## 2023-06-16 NOTE — Telephone Encounter (Signed)
   Pre-operative Risk Assessment    Patient Name: Christina Beltran  DOB: 1960/03/15 MRN: [id number]   Date of last office visit: 01/25/2020 Date of next office visit: None  Request for Surgical Clearance    Procedure:  Colonoscopy  Date of Surgery:  Clearance TBD                                 Surgeon:  Vonzell Guerin Surgeon's Group or Practice Name:  [facility] Phone number:  [phone] Fax number:  [phone]   Type of Clearance Requested:   - Medical    Type of Anesthesia:  Not Indicated   Additional requests/questions:    Signed, Lauris Port   06/16/2023, 4:05 PM

## 2023-06-18 NOTE — Telephone Encounter (Signed)
 Pt has been scheduled new pt appt to re-est as she needs preop clearance. I will update all parties involved.

## 2023-06-21 ENCOUNTER — Ambulatory Visit: Admission: EM | Admit: 2023-06-21 | Discharge: 2023-06-21 | Disposition: A | Discharge status: Home / Self Care

## 2023-06-21 DIAGNOSIS — R059 Cough, unspecified: Secondary | ICD-10-CM | POA: Diagnosis not present

## 2023-06-21 DIAGNOSIS — J069 Acute upper respiratory infection, unspecified: Secondary | ICD-10-CM | POA: Diagnosis not present

## 2023-06-21 MED ORDER — HYDROCODONE BIT-HOMATROP MBR 5-1.5 MG/5ML PO SOLN: 5.0000 mL | Freq: Four times a day (QID) | ORAL | 0 refills | Status: DC | PRN

## 2023-06-21 MED ORDER — DOXYCYCLINE HYCLATE 100 MG PO CAPS: 100.0000 mg | ORAL_CAPSULE | Freq: Two times a day (BID) | ORAL | 0 refills | Status: DC

## 2023-06-21 MED ORDER — PREDNISONE 10 MG (21) PO TBPK: ORAL_TABLET | Freq: Every day | ORAL | 0 refills | Status: DC

## 2023-06-21 NOTE — Discharge Instructions (Addendum)
 Advised patient to take medications as directed with food to completion.  Advised take prednisone with first dose of doxycycline to completion.  Advised may take Hycodan cough syrup at night prior to sleep due to sedative effects.  Encouraged to increase daily water intake to 64 ounces per day while taking these medications.  Advised if symptoms worsen and/or unresolved please follow-up with your PCP or here for further evaluation.

## 2023-06-21 NOTE — ED Provider Notes (Signed)
 [facility]    CSN: [id number] Arrival date & time: 06/21/23  1926      History   Chief Complaint Chief Complaint  Patient presents with   Cough    HPI Christina Beltran is a 63 y.o. female.   HPI Very pleasant 63 year old female presents with cough and congestion for 1 week.  PMH significant for obesity, HLD, and age-related osteoporosis.  Past Medical History:  Diagnosis Date   Hyperlipidemia     Patient Active Problem List   Diagnosis Date Noted   L1 vertebral fracture (HCC) 07/11/2021    Past Surgical History:  Procedure Laterality Date   ABDOMINOPLASTY  1990   BLADDER SUSPENSION  2000   CESAREAN SECTION  1988   compound fracture  1978   R arm x3 procedures    RHINOPLASTY  1980    OB History   No obstetric history on file.      Home Medications    Prior to Admission medications   Medication Sig Start Date End Date Taking? Authorizing Provider  doxycycline (VIBRAMYCIN) 100 MG capsule Take 1 capsule (100 mg total) by mouth 2 (two) times daily for 7 days. 06/21/23 06/28/23 Yes Leonides Ramp, FNP  HYDROcodone  bit-homatropine (HYCODAN) 5-1.5 MG/5ML syrup Take 5 mLs by mouth every 6 (six) hours as needed for cough. 06/21/23  Yes Leonides Ramp, FNP  predniSONE (STERAPRED UNI-PAK 21 TAB) 10 MG (21) TBPK tablet Take by mouth daily. Take 6 tabs by mouth daily  for 2 days, then 5 tabs for 2 days, then 4 tabs for 2 days, then 3 tabs for 2 days, 2 tabs for 2 days, then 1 tab by mouth daily for 2 days 06/21/23  Yes Leonides Ramp, FNP  atorvastatin  (LIPITOR) 20 MG tablet TAKE 1 TABLET BY MOUTH EVERYDAY AT BEDTIME Patient taking differently: Take 20 mg by mouth daily. 08/26/20   Tolia, Sunit, DO  Cholecalciferol  (D-3-5) 125 MCG (5000 UT) capsule Take 5,000 Units by mouth daily.    [provider]  methocarbamol  (ROBAXIN -750) 750 MG tablet Take 1 tablet (750 mg total) by mouth 4 (four) times daily. 07/13/21   Meyran, Kenard Paul, NP  metoprolol  succinate  (TOPROL -XL) 25 MG 24 hr tablet TAKE 1 TABLET BY MOUTH EVERY DAY IN THE MORNING Patient taking differently: Take 25 mg by mouth daily. 12/05/20   Tolia, Sunit, DO  phentermine 15 MG capsule Take 15 mg by mouth daily. 07/01/21   [provider]  topiramate (TOPAMAX) 25 MG tablet Take 25 mg by mouth 2 (two) times daily. 03/07/21   [provider]  zoledronic  acid (RECLAST ) 5 MG/100ML SOLN injection as directed Intravenous once a year    [provider]    Family History Family History  Problem Relation Age of Onset   Breast cancer Mother    Hypertension Mother    Hyperlipidemia Mother    Breast cancer Maternal Grandmother     Social History Social History   Tobacco Use   Smoking status: Never   Smokeless tobacco: Never  Vaping Use   Vaping status: Never Used  Substance Use Topics   Alcohol use: Yes    Alcohol/week: 5.0 standard drinks of alcohol    Types: 5 Glasses of wine per week    Comment: occa. weekly.    Drug use: Never     Allergies   Latex   Review of Systems Review of Systems  HENT:  Positive for congestion.   Respiratory:  Positive for cough.  All other systems reviewed and are negative.    Physical Exam Triage Vital Signs ED Triage Vitals  Encounter Vitals Group     BP      Systolic BP Percentile      Diastolic BP Percentile      Pulse      Resp      Temp      Temp src      SpO2      Weight      Height      Head Circumference      Peak Flow      Pain Score      Pain Loc      Pain Education      Exclude from Growth Chart    No data found.  Updated Vital Signs BP (!) 141/95   Pulse 71   Temp 98.3 F (36.8 C)   Resp 16   SpO2 98%   Visual Acuity Right Eye Distance:   Left Eye Distance:   Bilateral Distance:    Right Eye Near:   Left Eye Near:    Bilateral Near:     Physical Exam Vitals and nursing note reviewed.  Constitutional:      Appearance: Normal appearance. She is normal weight. She is  ill-appearing.  HENT:     Head: Normocephalic and atraumatic.     Right Ear: Tympanic membrane, ear canal and external ear normal.     Left Ear: Tympanic membrane, ear canal and external ear normal.     Mouth/Throat:     Mouth: Mucous membranes are moist.     Pharynx: Oropharynx is clear.  Eyes:     Extraocular Movements: Extraocular movements intact.     Conjunctiva/sclera: Conjunctivae normal.     Pupils: Pupils are equal, round, and reactive to light.  Cardiovascular:     Rate and Rhythm: Normal rate and regular rhythm.     Pulses: Normal pulses.     Heart sounds: Normal heart sounds.  Pulmonary:     Effort: Pulmonary effort is normal.     Breath sounds: Normal breath sounds. No wheezing, rhonchi or rales.     Comments: Frequent nonproductive cough on exam Musculoskeletal:        General: Normal range of motion.     Cervical back: Normal range of motion and neck supple.  Skin:    General: Skin is warm and dry.  Neurological:     General: No focal deficit present.     Mental Status: She is alert and oriented to person, place, and time. Mental status is at baseline.  Psychiatric:        Mood and Affect: Mood normal.        Behavior: Behavior normal.      UC Treatments / Results  Labs (all labs ordered are listed, but only abnormal results are displayed) Labs Reviewed - No data to display  EKG   Radiology No results found.  Procedures Procedures (including critical care time)  Medications Ordered in UC Medications - No data to display  Initial Impression / Assessment and Plan / UC Course  I have reviewed the triage vital signs and the nursing notes.  Pertinent labs & imaging results that were available during my care of the patient were reviewed by me and considered in my medical decision making (see chart for details).     MDM: 1.  Acute URI-Rx'd doxycycline 100 mg capsule: Take 1 capsule twice daily x 7 days;  2.  Cough, unspecified type-Rx'd Sterapred  Unipak (21 tab 10 mg taper), Rx'd Hycodan 5-1.5 mg/5 mL syrup: Take 5 mL every 6 hours, as needed for cough. Advised patient to take medications as directed with food to completion.  Advised take prednisone with first dose of doxycycline to completion.  Advised may take Hycodan cough syrup at night prior to sleep due to sedative effects.  Encouraged to increase daily water intake to 64 ounces per day while taking these medications.  Advised if symptoms worsen and/or unresolved please follow-up with your PCP or here for further evaluation.  Final Clinical Impressions(s) / UC Diagnoses   Final diagnoses:  Acute URI  Cough, unspecified type     Discharge Instructions      Advised patient to take medications as directed with food to completion.  Advised take prednisone with first dose of doxycycline to completion.  Advised may take Hycodan cough syrup at night prior to sleep due to sedative effects.  Encouraged to increase daily water intake to 64 ounces per day while taking these medications.  Advised if symptoms worsen and/or unresolved please follow-up with your PCP or here for further evaluation.   ED Prescriptions     Medication Sig Dispense Auth. Provider   doxycycline (VIBRAMYCIN) 100 MG capsule Take 1 capsule (100 mg total) by mouth 2 (two) times daily for 7 days. 14 capsule Revis Whalin, FNP   predniSONE (STERAPRED UNI-PAK 21 TAB) 10 MG (21) TBPK tablet Take by mouth daily. Take 6 tabs by mouth daily  for 2 days, then 5 tabs for 2 days, then 4 tabs for 2 days, then 3 tabs for 2 days, 2 tabs for 2 days, then 1 tab by mouth daily for 2 days 42 tablet Leonides Ramp, FNP   HYDROcodone  bit-homatropine (HYCODAN) 5-1.5 MG/5ML syrup Take 5 mLs by mouth every 6 (six) hours as needed for cough. 120 mL Leonides Ramp, FNP      I have reviewed the PDMP during this encounter.   Leonides Ramp, FNP 06/21/23 1955

## 2023-06-21 NOTE — ED Triage Notes (Signed)
 Pt presents to [facility] with co cough and chest congestion for one week.

## 2023-06-28 ENCOUNTER — Encounter: Payer: Self-pay | Admitting: Cardiology

## 2023-06-28 ENCOUNTER — Ambulatory Visit: Attending: Cardiology | Admitting: Cardiology

## 2023-06-28 ENCOUNTER — Ambulatory Visit

## 2023-06-28 VITALS — BP 116/70 | HR 50 | Resp 16 | Ht 65.0 in | Wt 153.4 lb

## 2023-06-28 DIAGNOSIS — R001 Bradycardia, unspecified: Secondary | ICD-10-CM | POA: Insufficient documentation

## 2023-06-28 DIAGNOSIS — R9431 Abnormal electrocardiogram [ECG] [EKG]: Secondary | ICD-10-CM | POA: Insufficient documentation

## 2023-06-28 DIAGNOSIS — I493 Ventricular premature depolarization: Secondary | ICD-10-CM | POA: Insufficient documentation

## 2023-06-28 DIAGNOSIS — Z01810 Encounter for preprocedural cardiovascular examination: Secondary | ICD-10-CM | POA: Insufficient documentation

## 2023-06-28 NOTE — Progress Notes (Unsigned)
 [employer] cardiac event monitor serial # [id number] from office inventory applied to patient.

## 2023-06-28 NOTE — Patient Instructions (Addendum)
 Testing/Procedures: GXT   Exercise Tolerance Test  Please arrive 15 minutes prior to your appointment time for registration and insurance purposes.  The test will take approximately 45 minutes to complete.  How to prepare for your Exercise Stress Test: Do bring a list of your current medications with you.  If not listed below, you may take your medications as normal. HOLD METOPROLOL  MORNING OF TEST  Do wear comfortable clothes (no dresses or overalls) and walking shoes, tennis shoes preferred (no heels or open toed shoes are allowed) Do Not wear cologne, perfume, aftershave or lotions (deodorant is allowed). Please report to [address] for your test.  If these instructions are not followed, your test will have to be rescheduled.  If you have questions or concerns about your appointment, you can call the Stress Lab at [phone].  If you cannot keep your appointment, please provide 24 hours notification to the Stress Lab, to avoid a possible $50 charge to your account.   ECHO  Your physician has requested that you have an echocardiogram. Echocardiography is a painless test that uses sound waves to create images of your heart. It provides your doctor with information about the size and shape of your heart and how well your heart's chambers and valves are working. This procedure takes approximately one hour. There are no restrictions for this procedure. Please do NOT wear cologne, perfume, aftershave, or lotions (deodorant is allowed). Please arrive 15 minutes prior to your appointment time.  Please note: We ask at that you not bring children with you during ultrasound (echo/ vascular) testing. Due to room size and safety concerns, children are not allowed in the ultrasound rooms during exams. Our front office staff cannot provide observation of children in our lobby area while testing is being conducted. An adult accompanying a patient to their appointment will only be  allowed in the ultrasound room at the discretion of the ultrasound technician under special circumstances. We apologize for any inconvenience.    30 DAY EVENT MONITOR   Your physician has recommended that you wear an event monitor. Event monitors are medical devices that record the heart's electrical activity. Doctors most often us  these monitors to diagnose arrhythmias. Arrhythmias are problems with the speed or rhythm of the heartbeat. The monitor is a small, portable device. You can wear one while you do your normal daily activities. This is usually used to diagnose what is causing palpitations/syncope (passing out).   Follow-Up: At [facility], you and your health needs are our priority.  As part of our continuing mission to provide you with exceptional heart care, our providers are all part of one team.  This team includes your primary Cardiologist (physician) and Advanced Practice Providers or APPs (Physician Assistants and Nurse Practitioners) who all work together to provide you with the care you need, when you need it.  Your next appointment:   AS NEEDED  Provider:   Cody Das, MD

## 2023-06-28 NOTE — Addendum Note (Signed)
 Addended by: Miles Allan B on: 06/28/2023 05:52 PM   Modules accepted: Orders

## 2023-06-28 NOTE — Progress Notes (Signed)
 Cardiology Office Note:  .   Date:  06/28/2023  ID:  Christina Beltran, DOB 1960-06-23, MRN [id number] PCP: Christina Brittle, MD (Inactive)  [facility] Providers Cardiologist:  Christina Ivy, MD PCP: Christina Brittle, MD (Inactive)  Chief Complaint  Patient presents with   Pre-op Exam   New Patient (Initial Visit)     Christina Beltran is a 62 y.o. female with hyperlipidemia, PVC  History of Present Illness  Patient was last seen by Dr. Albert Huff in 2021.  At that time, she was placed on metoprolol  due to PVCs noted on otherwise normal stress test.  Patient is going to undergo EGD for suspected esophageal spasm.  She has reported symptoms of palpitations that improved with propranolol, occur a couple times a month.  She has also had left arm pain, but denies any chest pain.  She walks about a mile a day, without any chest pain,     Vitals:   06/28/23 1130  BP: 116/70  Pulse: (!) 50  Resp: 16  SpO2: 99%      Review of Systems  Cardiovascular:  Positive for palpitations. Negative for chest pain, dyspnea on exertion, leg swelling and syncope.        Studies Reviewed: Christina Beltran        EKG 06/28/2023: Sinus bradycardia 47 bpm Nonspecific ST and T wave abnormality No previous ECGs available  Independently interpreted 11/2022: Chol 150, TG 103, HDL 53, LDL 79 Hb 15 Cr 0.9     Echocardiogram 11/22/2019:  Left ventricle cavity is normal in size. Mild asymmetric hypertrophy of  the left ventricle. Septum measures 1.4 cm.  Normal LV systolic function  with EF 66%. Normal global wall motion. Normal diastolic filling pattern.  Left atrial cavity is mildly dilated.  Mild (Grade I) mitral regurgitation.  Normal right atrial pressure.   Exercise Sestamibi stress test 11/29/2019: Exercise nuclear stress test was performed using Bruce protocol. Patient exercised for , reached 7.82METS, and 86% of age predicted maximum heart rate.  Equivocal ECG stress. Myocardial perfusion is  normal. Stress LV EF: 60%.  Overall LV systolic function is normal without regional wall motion abnormalities. No previous exam available for comparison. Low risk study.    Physical Exam Vitals and nursing note reviewed.  Constitutional:      General: She is not in acute distress. Neck:     Vascular: No JVD.  Cardiovascular:     Rate and Rhythm: Normal rate and regular rhythm.     Heart sounds: Normal heart sounds. No murmur heard. Pulmonary:     Effort: Pulmonary effort is normal.     Breath sounds: Normal breath sounds. No wheezing or rales.  Musculoskeletal:     Right lower leg: No edema.     Left lower leg: No edema.      VISIT DIAGNOSES:   ICD-10-CM   1. Preop cardiovascular exam  Z01.810 EKG 12-Lead    2. Bradycardia  R00.1 Exercise Tolerance Test    3. Abnormal EKG  R94.31 ECHOCARDIOGRAM COMPLETE    4. PVC (premature ventricular contraction)  I49.3 Cardiac event monitor       Christina Beltran is a 63 y.o. female with hyperlipidemia, PVC Assessment & Plan  Palpitations: Known prior diagnosis of PVCs, currently on metoprolol  succinate 25 mg daily. Resting heart rate in 40s. Recommend exercise treadmill stress test by holding metoprolol , to ensure chronotropic competence.  Also recommend 30-day monitor and echocardiogram. Regardless of above test, I do not see any problem with proceeding  with the EGD in the near future.   Informed Consent   Shared Decision Making/Informed Consent The risks [chest pain, shortness of breath, cardiac arrhythmias, dizziness, blood pressure fluctuations, myocardial infarction, stroke/transient ischemic attack, and life-threatening complications (estimated to be 1 in 10,000)], benefits (risk stratification, diagnosing coronary artery disease, treatment guidance) and alternatives of an exercise tolerance test were discussed in detail with Christina Beltran and she agrees to proceed.         F/u as needed  Signed, Cody Das, MD

## 2023-06-29 NOTE — Addendum Note (Signed)
 Addended by: Cody Das on: 06/29/2023 08:28 AM   Modules accepted: Orders

## 2023-07-01 ENCOUNTER — Telehealth: Payer: Self-pay

## 2023-07-01 DIAGNOSIS — I480 Paroxysmal atrial fibrillation: Secondary | ICD-10-CM

## 2023-07-01 NOTE — Telephone Encounter (Signed)
 Caller Fredrik Jensen) is reporting urgent results.

## 2023-07-01 NOTE — Telephone Encounter (Signed)
 Call received from [place] from 5/21 at 7:06pm. This critical monitor has been addressed.

## 2023-07-01 NOTE — Telephone Encounter (Signed)
   Cardiac Monitor Alert  Date of alert:  07/01/2023   Patient Name: Christina Beltran  DOB: Oct 23, 1960  MRN: [id number]   [facility] Cardiologist: Cody Das, MD  [facility] EP:  None    Monitor Information: Cardiac Event Monitor [[employer]]  Reason:  PVCs Ordering provider:  Dr. Filiberto Hug   Alert Atrial Fibrillation/Flutter This is the 1st alert for this rhythm. 5/21 @ 7:06pm The patient has no hx of Atrial Fibrillation/Flutter.  The patient is not currently on anticoagulation.  Next Cardiology Appointment Date:  to be determined- pt told to return PRN   Provider:  Dr. Filiberto Hug  The patient was contacted today.  She is symptomatic.  She reports the following symptoms:  heart racing, shortness of breath and a dry cough, occasionally her left arm will feel achy during episode. Pt reports that frequency can be once a day to once every 2-3 weeks. Pt reports that she has not taken her phentermine for the last 3 weeks.  Arrhythmia, symptoms and history reviewed with Dr. Audery Blazing.  Plan:  Per Dr. Audery Blazing, Chadsvasc= 1, pt at low risk for embolic event. Pt will continue on metoprolol  succinate 25mg  daily. Continue to monitor symptoms. Per Dr. Audery Blazing, will place referral to a-fib clinic. Pt should be seen in a-fib clinic prior to colonoscopy.     Other: Monitor strips in chart under media.  Joelee Snoke L, RN  07/01/2023 8:27 AM

## 2023-07-01 NOTE — Telephone Encounter (Signed)
 Thank you. Continue monitoring on Zio to assess Afib burden. With upcoming plans for colonoscopy and low stroke risk, do not recommend anticoagulation at this time.  Thanks MJP

## 2023-07-07 DIAGNOSIS — I493 Ventricular premature depolarization: Secondary | ICD-10-CM | POA: Diagnosis not present

## 2023-07-07 DIAGNOSIS — R1310 Dysphagia, unspecified: Secondary | ICD-10-CM | POA: Diagnosis not present

## 2023-07-07 DIAGNOSIS — Z1211 Encounter for screening for malignant neoplasm of colon: Secondary | ICD-10-CM | POA: Diagnosis not present

## 2023-07-08 ENCOUNTER — Telehealth: Payer: Self-pay

## 2023-07-08 NOTE — Telephone Encounter (Signed)
   Pre-operative Risk Assessment    Patient Name: Christina Beltran  DOB: 08-17-1960 MRN: [id number]   Date of last office visit: 06/28/23 with Dr. Filiberto Hug Date of next office visit: None   Request for Surgical Clearance    Procedure:  Colonoscopy and Endoscopy with dilation.   Date of Surgery:  Clearance 08/24/23                                Surgeon:  Dr. Genell Ken  Surgeon's Group or Practice Name:  [facility] Phone number:  [phone] Fax number:  [phone]   Type of Clearance Requested:   - Medical    Type of Anesthesia:  Propofol   Additional requests/questions:    Kenny Peals   07/08/2023, 9:36 AM

## 2023-07-08 NOTE — Telephone Encounter (Signed)
   Patient Name: Christina Beltran  DOB: December 28, 1960 MRN: [id number]  Primary Cardiologist: Cody Das, MD  Chart reviewed as part of pre-operative protocol coverage.  Patient was last seen in office on 06/28/2023 by Dr. Filiberto Hug and was cleared for upcoming endoscopy procedure.  Therefore, given past medical history and time since last visit, based on ACC/AHA guidelines, Christina Beltran is at acceptable risk for the planned procedure without further cardiovascular testing.   I will route this recommendation, as well as the note from most recent office visit to the requesting party via Epic fax function and remove from pre-op pool.  Please call with questions.  Jude Norton, NP 07/08/2023, 9:55 AM

## 2023-07-15 ENCOUNTER — Ambulatory Visit (HOSPITAL_COMMUNITY)
Admission: RE | Admit: 2023-07-15 | Discharge: 2023-07-15 | Disposition: A | Discharge status: Home / Self Care | Source: Ambulatory Visit | Attending: Internal Medicine | Admitting: Internal Medicine

## 2023-07-15 VITALS — BP 108/72 | HR 56 | Ht 65.0 in | Wt 153.6 lb

## 2023-07-15 DIAGNOSIS — I4892 Unspecified atrial flutter: Secondary | ICD-10-CM | POA: Diagnosis not present

## 2023-07-15 DIAGNOSIS — I4891 Unspecified atrial fibrillation: Secondary | ICD-10-CM

## 2023-07-15 MED ORDER — METOPROLOL TARTRATE 25 MG PO TABS: ORAL_TABLET | ORAL | 3 refills | Status: DC

## 2023-07-15 NOTE — Progress Notes (Signed)
 Primary Care Physician: Berta Brittle, MD (Inactive) Primary Cardiologist: Cody Das, MD Electrophysiologist: None     Referring Physician: Dr. Alfrieda Antes is a 63 y.o. female with a history of HLD, PVC, and atrial flutter who presents for consultation in the [facility]. Cardiac monitor ordered by cardiologist on 5/19 has detected atrial flutter. Patient has a CHADS2VASC score of 1.  On evaluation today, she is currently in NSR. Patient is still wearing monitor and notes her episode of atrial flutter lasted for about 10 minutes. She has stopped drinking alcohol and has been successfully losing weight. She notes to have history of snoring but this stopped after cosmetic procedure. She does admit to having a diet coke consumption she needs to reduce.   Today, she denies symptoms of chest pain, shortness of breath, orthopnea, PND, lower extremity edema, dizziness, presyncope, syncope, snoring, daytime somnolence, bleeding, or neurologic sequela. The patient is tolerating medications without difficulties and is otherwise without complaint today.    Atrial Fibrillation Risk Factors:  she does not have symptoms or diagnosis of sleep apnea.   she has a BMI of Body mass index is 25.56 kg/m.Aaron Aas Filed Weights   07/15/23 0830  Weight: 69.7 kg    Current Outpatient Medications  Medication Sig Dispense Refill   atorvastatin  (LIPITOR) 20 MG tablet TAKE 1 TABLET BY MOUTH EVERYDAY AT BEDTIME (Patient taking differently: Take 20 mg by mouth daily.) 90 tablet 0   Cholecalciferol  (D-3-5) 125 MCG (5000 UT) capsule Take 5,000 Units by mouth daily.     metoprolol  succinate (TOPROL -XL) 25 MG 24 hr tablet TAKE 1 TABLET BY MOUTH EVERY DAY IN THE MORNING (Patient taking differently: Take 25 mg by mouth daily.) 90 tablet 0   metoprolol  tartrate (LOPRESSOR ) 25 MG tablet Take 1 tablet by mouth twice daily as needed for HR greater than 100 and systolic  pressure needs to be above 100 30 tablet 3   phentermine 15 MG capsule Take 15 mg by mouth daily.     topiramate (TOPAMAX) 25 MG tablet Take 25 mg by mouth daily.     Turmeric (QC TUMERIC COMPLEX PO) Take 1 capsule by mouth daily.     zoledronic  acid (RECLAST ) 5 MG/100ML SOLN injection as directed Intravenous once a year     No current facility-administered medications for this encounter.    Atrial Fibrillation Management history:  Previous antiarrhythmic drugs: none Previous cardioversions: none Previous ablations: none Anticoagulation history: none   ROS- All systems are reviewed and negative except as per the HPI above.  Physical Exam: BP 108/72   Pulse (!) 56   Ht 5\' 5"  (1.651 m)   Wt 69.7 kg   BMI 25.56 kg/m   GEN: Well nourished, well developed in no acute distress NECK: No JVD; No carotid bruits CARDIAC: Regular rate and rhythm, no murmurs, rubs, gallops RESPIRATORY:  Clear to auscultation without rales, wheezing or rhonchi  ABDOMEN: Soft, non-tender, non-distended EXTREMITIES:  No edema; No deformity   EKG today demonstrates  Vent. rate 56 BPM PR interval 154 ms QRS duration 72 ms QT/QTcB 456/440 ms P-R-T axes 64 45 131 Sinus bradycardia ST & T wave abnormality, consider lateral ischemia Abnormal ECG When compared with ECG of 28-Jun-2023 11:33, No significant change was found  Echo in 2021 showed normal function. Repeat echo scheduled for 08/09/23.  ASSESSMENT & PLAN CHA2DS2-VASc Score = 1  The patient's score is based upon: CHF History: 0 HTN  History: 0 Diabetes History: 0 Stroke History: 0 Vascular Disease History: 0 Age Score: 0 Gender Score: 1       ASSESSMENT AND PLAN: Paroxysmal Atrial Flutter The patient's CHA2DS2-VASc score is 1, indicating a 0.6% annual risk of stroke.    She is currently in NSR. Education provided about Afib. Discussion about triggers for Afib. We discussed with low risk score she does not meet criteria for  anticoagulation. Will schedule follow up visit to revisit monitor results. Rhythm monitoring device recommended. Continue Toprol  25 mg daily. Will prescribe PRN Lopressor . At this time, no strong indication for sleep study so will defer.      Follow up 4-6 weeks to discuss monitor results.    Minnie Amber, PA-C  Afib Clinic [facility] [address] [place], [place] [address] [phone]

## 2023-07-15 NOTE — Patient Instructions (Signed)
 Take Lopressor  25mg - Take 1 tablet twice daily as needed for heart rate greater than 100 and systolic pressure needs to be above 100

## 2023-07-22 ENCOUNTER — Other Ambulatory Visit (HOSPITAL_COMMUNITY): Payer: Self-pay | Admitting: Internal Medicine

## 2023-07-30 ENCOUNTER — Ambulatory Visit: Payer: Self-pay | Admitting: Cardiology

## 2023-07-30 DIAGNOSIS — I493 Ventricular premature depolarization: Secondary | ICD-10-CM | POA: Diagnosis not present

## 2023-07-30 NOTE — Progress Notes (Signed)
 Recent but known finding of atrial flutter, evaluated in Afib clinic and has follow up scheduled.  Christina Das, MD

## 2023-08-02 ENCOUNTER — Encounter (HOSPITAL_COMMUNITY): Payer: Self-pay | Admitting: *Deleted

## 2023-08-02 ENCOUNTER — Telehealth (HOSPITAL_COMMUNITY): Payer: Self-pay | Admitting: *Deleted

## 2023-08-02 NOTE — Telephone Encounter (Signed)
 Reminder letter with instructions for upcoming ETT on 08/09/23 sent via [employer].

## 2023-08-09 ENCOUNTER — Other Ambulatory Visit: Payer: Self-pay

## 2023-08-09 ENCOUNTER — Ambulatory Visit (HOSPITAL_COMMUNITY)
Admission: RE | Admit: 2023-08-09 | Discharge: 2023-08-09 | Disposition: A | Discharge status: Home / Self Care | Source: Ambulatory Visit | Attending: Cardiovascular Disease | Admitting: Cardiovascular Disease

## 2023-08-09 ENCOUNTER — Ambulatory Visit (HOSPITAL_COMMUNITY)
Admission: RE | Admit: 2023-08-09 | Discharge: 2023-08-09 | Disposition: A | Discharge status: Home / Self Care | Source: Ambulatory Visit | Attending: Cardiology | Admitting: Cardiology

## 2023-08-09 DIAGNOSIS — R9431 Abnormal electrocardiogram [ECG] [EKG]: Secondary | ICD-10-CM | POA: Diagnosis not present

## 2023-08-09 DIAGNOSIS — R001 Bradycardia, unspecified: Secondary | ICD-10-CM | POA: Diagnosis not present

## 2023-08-09 LAB — EXERCISE TOLERANCE TEST
Angina Index: 0
Duke Treadmill Score: 7
Estimated workload: 8.9
Exercise duration (min): 7 min
Exercise duration (sec): 15 s
MPHR: 157 {beats}/min
Peak HR: 136 {beats}/min
Percent HR: 87 %
RPE: 18
Rest HR: 50 {beats}/min
ST Depression (mm): 0 mm

## 2023-08-09 LAB — ECHOCARDIOGRAM COMPLETE
Area-P 1/2: 3.62 cm2
S' Lateral: 2.1 cm

## 2023-08-17 ENCOUNTER — Ambulatory Visit (HOSPITAL_COMMUNITY)
Admission: RE | Admit: 2023-08-17 | Discharge: 2023-08-17 | Disposition: A | Discharge status: Home / Self Care | Source: Ambulatory Visit | Attending: Internal Medicine | Admitting: Internal Medicine

## 2023-08-17 VITALS — BP 110/72 | HR 59 | Ht 65.0 in | Wt 155.0 lb

## 2023-08-17 DIAGNOSIS — I4891 Unspecified atrial fibrillation: Secondary | ICD-10-CM

## 2023-08-17 DIAGNOSIS — I4892 Unspecified atrial flutter: Secondary | ICD-10-CM | POA: Diagnosis not present

## 2023-08-17 NOTE — Progress Notes (Signed)
 Primary Care Physician: Delice Charleston, MD (Inactive) Primary Cardiologist: Newman JINNY Lawrence, MD Electrophysiologist: None     Referring Physician: Dr. Lawrence Harriette Queen is a 63 y.o. female with a history of HLD, PVC, and atrial flutter who presents for consultation in the [facility]. Cardiac monitor ordered by cardiologist on 5/19 has detected atrial flutter. Patient has a CHADS2VASC score of 1.  On evaluation today, she is currently in NSR. Patient is still wearing monitor and notes her episode of atrial flutter lasted for about 10 minutes. She has stopped drinking alcohol and has been successfully losing weight. She notes to have history of snoring but this stopped after cosmetic procedure. She does admit to having a diet coke consumption she needs to reduce.   On follow up 08/17/23, she is currently in NSR. Cardiac monitor showed 1% Afib burden. She has taken Lopressor  PRN palpitations.   Today, she denies symptoms of chest pain, shortness of breath, orthopnea, PND, lower extremity edema, dizziness, presyncope, syncope, snoring, daytime somnolence, bleeding, or neurologic sequela. The patient is tolerating medications without difficulties and is otherwise without complaint today.    Atrial Fibrillation Risk Factors:  she does not have symptoms or diagnosis of sleep apnea.   she has a BMI of Body mass index is 25.79 kg/m.SABRA Filed Weights   08/17/23 0919  Weight: 70.3 kg     Current Outpatient Medications  Medication Sig Dispense Refill   atorvastatin  (LIPITOR) 20 MG tablet TAKE 1 TABLET BY MOUTH EVERYDAY AT BEDTIME 90 tablet 0   Cholecalciferol  (D-3-5) 125 MCG (5000 UT) capsule Take 5,000 Units by mouth daily.     Cyanocobalamin (VITAMIN B12 PO) Take 1 tablet by mouth every morning.     metoprolol  succinate (TOPROL -XL) 25 MG 24 hr tablet TAKE 1 TABLET BY MOUTH EVERY DAY IN THE MORNING 90 tablet 0   metoprolol  tartrate (LOPRESSOR ) 25 MG  tablet TAKE 1 TABLET BY MOUTH TWICE DAILY AS NEEDED FOR HR GREATER THAN 100 AND SYSTOLIC PRESSURE NEEDS TO BE ABOVE 100 60 tablet 1   phentermine 15 MG capsule Take 15 mg by mouth daily.     topiramate (TOPAMAX) 25 MG tablet Take 25 mg by mouth daily.     Turmeric (QC TUMERIC COMPLEX PO) Take 1 capsule by mouth daily.     zoledronic  acid (RECLAST ) 5 MG/100ML SOLN injection as directed Intravenous once a year     No current facility-administered medications for this encounter.    Atrial Fibrillation Management history:  Previous antiarrhythmic drugs: none Previous cardioversions: none Previous ablations: none Anticoagulation history: none   ROS- All systems are reviewed and negative except as per the HPI above.  Physical Exam: BP 110/72   Pulse (!) 59   Ht 5' 5 (1.651 m)   Wt 70.3 kg   BMI 25.79 kg/m   GEN- The patient is well appearing, alert and oriented x 3 today.   Neck - no JVD or carotid bruit noted Lungs- Clear to ausculation bilaterally, normal work of breathing Heart- Regular rate and rhythm, no murmurs, rubs or gallops, PMI not laterally displaced Extremities- no clubbing, cyanosis, or edema Skin - no rash or ecchymosis noted   EKG today demonstrates  Vent. rate 59 BPM PR interval 154 ms QRS duration 70 ms QT/QTcB 426/421 ms P-R-T axes 59 43 130 Sinus bradycardia Minimal voltage criteria for LVH, may be normal variant ( R in aVL ) Abnormal ECG When compared with  ECG of 09-Aug-2023 14:24, Vent. rate has decreased BY 59 BPM   Echo 08/09/23:  1. Left ventricular ejection fraction, by estimation, is 70 to 75%. Left  ventricular ejection fraction by 3D volume is 68 %. The left ventricle has  hyperdynamic function. The left ventricle has no regional wall motion  abnormalities. Left ventricular  diastolic parameters were normal.   2. Right ventricular systolic function is normal. The right ventricular  size is normal. There is normal pulmonary artery systolic  pressure. The  estimated right ventricular systolic pressure is 23.4 mmHg.   3. Left atrial size was severely dilated.   4. The mitral valve is normal in structure. Mild mitral valve  regurgitation. No evidence of mitral stenosis.   5. The aortic valve is tricuspid. Aortic valve regurgitation is not  visualized. No aortic stenosis is present.   6. The inferior vena cava is normal in size with greater than 50%  respiratory variability, suggesting right atrial pressure of 3 mmHg.   Cardiac monitor 5-07/2023: Mobile cardiac outpatient telemetry 30 days 06/28/2023 - 07/27/2023: Dominant rhythm: Sinus. HR 37-150 bpm. Avg HR 56 bpm. 28 episodes of atrial flutter, 1% burden, rate 37-135 bpm. <1% PAC, PVC burden. No SVT/VT/high grade AV block, sinus pause >3sec noted. 4 patient triggered events.    ASSESSMENT & PLAN CHA2DS2-VASc Score = 1  The patient's score is based upon: CHF History: 0 HTN History: 0 Diabetes History: 0 Stroke History: 0 Vascular Disease History: 0 Age Score: 0 Gender Score: 1       ASSESSMENT AND PLAN: Paroxysmal Atrial Flutter The patient's CHA2DS2-VASc score is 1, indicating a 0.6% annual risk of stroke.    She is currently in NSR. We discussed echocardiogram results and monitor results. At this time with 1% burden on monitor and brief PAF episodes, counseled on lifestyle changes / consideration for sleep study. Patient will continue Toprol  and serial observation with rhythm monitoring device.      Follow up Afib clinic prn.   Terra Pac, PA-C  Afib Clinic [facility] [address] [place], [place] [address] [phone]

## 2023-08-19 ENCOUNTER — Other Ambulatory Visit (HOSPITAL_COMMUNITY): Payer: Self-pay | Admitting: Internal Medicine

## 2023-08-24 DIAGNOSIS — K3189 Other diseases of stomach and duodenum: Secondary | ICD-10-CM | POA: Diagnosis not present

## 2023-08-24 DIAGNOSIS — K5710 Diverticulosis of small intestine without perforation or abscess without bleeding: Secondary | ICD-10-CM | POA: Diagnosis not present

## 2023-08-24 DIAGNOSIS — K317 Polyp of stomach and duodenum: Secondary | ICD-10-CM | POA: Diagnosis not present

## 2023-08-24 DIAGNOSIS — D124 Benign neoplasm of descending colon: Secondary | ICD-10-CM | POA: Diagnosis not present

## 2023-08-24 DIAGNOSIS — K5730 Diverticulosis of large intestine without perforation or abscess without bleeding: Secondary | ICD-10-CM | POA: Diagnosis not present

## 2023-08-24 DIAGNOSIS — Z1211 Encounter for screening for malignant neoplasm of colon: Secondary | ICD-10-CM | POA: Diagnosis not present

## 2023-08-24 DIAGNOSIS — K209 Esophagitis, unspecified without bleeding: Secondary | ICD-10-CM | POA: Diagnosis not present

## 2023-08-24 DIAGNOSIS — R1310 Dysphagia, unspecified: Secondary | ICD-10-CM | POA: Diagnosis not present

## 2023-08-24 DIAGNOSIS — K648 Other hemorrhoids: Secondary | ICD-10-CM | POA: Diagnosis not present

## 2023-08-24 DIAGNOSIS — K222 Esophageal obstruction: Secondary | ICD-10-CM | POA: Diagnosis not present

## 2023-10-28 DIAGNOSIS — L570 Actinic keratosis: Secondary | ICD-10-CM | POA: Diagnosis not present

## 2023-10-28 DIAGNOSIS — Z86018 Personal history of other benign neoplasm: Secondary | ICD-10-CM | POA: Diagnosis not present

## 2023-10-28 DIAGNOSIS — D225 Melanocytic nevi of trunk: Secondary | ICD-10-CM | POA: Diagnosis not present

## 2023-10-28 DIAGNOSIS — D2272 Melanocytic nevi of left lower limb, including hip: Secondary | ICD-10-CM | POA: Diagnosis not present

## 2023-10-28 DIAGNOSIS — D2261 Melanocytic nevi of right upper limb, including shoulder: Secondary | ICD-10-CM | POA: Diagnosis not present

## 2023-10-28 DIAGNOSIS — D2271 Melanocytic nevi of right lower limb, including hip: Secondary | ICD-10-CM | POA: Diagnosis not present

## 2023-10-28 DIAGNOSIS — L821 Other seborrheic keratosis: Secondary | ICD-10-CM | POA: Diagnosis not present

## 2023-10-28 DIAGNOSIS — L578 Other skin changes due to chronic exposure to nonionizing radiation: Secondary | ICD-10-CM | POA: Diagnosis not present

## 2023-10-28 DIAGNOSIS — D2262 Melanocytic nevi of left upper limb, including shoulder: Secondary | ICD-10-CM | POA: Diagnosis not present

## 2023-11-17 DIAGNOSIS — Z5320 Procedure and treatment not carried out because of patient's decision for unspecified reasons: Secondary | ICD-10-CM | POA: Diagnosis not present

## 2023-11-17 DIAGNOSIS — E559 Vitamin D deficiency, unspecified: Secondary | ICD-10-CM | POA: Diagnosis not present

## 2023-11-17 DIAGNOSIS — T753XXA Motion sickness, initial encounter: Secondary | ICD-10-CM | POA: Diagnosis not present

## 2023-11-17 DIAGNOSIS — Z79899 Other long term (current) drug therapy: Secondary | ICD-10-CM | POA: Diagnosis not present

## 2023-11-17 DIAGNOSIS — Z23 Encounter for immunization: Secondary | ICD-10-CM | POA: Diagnosis not present

## 2023-11-17 DIAGNOSIS — Z0000 Encounter for general adult medical examination without abnormal findings: Secondary | ICD-10-CM | POA: Diagnosis not present

## 2023-11-17 DIAGNOSIS — I493 Ventricular premature depolarization: Secondary | ICD-10-CM | POA: Diagnosis not present

## 2023-11-17 DIAGNOSIS — E782 Mixed hyperlipidemia: Secondary | ICD-10-CM | POA: Diagnosis not present

## 2023-11-17 DIAGNOSIS — Z923 Personal history of irradiation: Secondary | ICD-10-CM | POA: Diagnosis not present

## 2023-11-17 DIAGNOSIS — M810 Age-related osteoporosis without current pathological fracture: Secondary | ICD-10-CM | POA: Diagnosis not present

## 2023-11-17 DIAGNOSIS — E663 Overweight: Secondary | ICD-10-CM | POA: Diagnosis not present

## 2023-12-09 ENCOUNTER — Telehealth (HOSPITAL_COMMUNITY): Payer: Self-pay

## 2023-12-09 ENCOUNTER — Other Ambulatory Visit (HOSPITAL_COMMUNITY): Payer: Self-pay | Admitting: Internal Medicine

## 2023-12-09 DIAGNOSIS — M810 Age-related osteoporosis without current pathological fracture: Secondary | ICD-10-CM | POA: Insufficient documentation

## 2023-12-09 NOTE — Telephone Encounter (Signed)
 Auth Submission: NO AUTH NEEDED Site of care: Site of care: [facility] Payer: [employer] Medication & CPT/J Code(s) submitted: Reclast  (Zolendronic acid) [id number] Diagnosis Code: M81.0 Route of submission (phone, fax, portal):  Phone # Fax # Auth type: Buy/Bill HB Units/visits requested: 5mg  x 1 dose Reference number:  Approval from: 12/09/23 to 02/09/24

## 2024-01-14 ENCOUNTER — Inpatient Hospital Stay (HOSPITAL_COMMUNITY): Admission: RE | Admit: 2024-01-14 | Source: Ambulatory Visit

## 2024-01-18 ENCOUNTER — Inpatient Hospital Stay (HOSPITAL_COMMUNITY): Admission: RE | Admit: 2024-01-18 | Discharge: 2024-01-18 | Attending: Internal Medicine | Admitting: Internal Medicine

## 2024-01-18 VITALS — BP 116/80 | HR 57 | Temp 97.7°F | Resp 16

## 2024-01-18 DIAGNOSIS — M810 Age-related osteoporosis without current pathological fracture: Secondary | ICD-10-CM | POA: Diagnosis present

## 2024-01-18 MED ORDER — DIPHENHYDRAMINE HCL 25 MG PO CAPS: 25.0000 mg | ORAL_CAPSULE | Freq: Once | ORAL | Status: DC

## 2024-01-18 MED ORDER — ZOLEDRONIC ACID 5 MG/100ML IV SOLN
5.0000 mg | Freq: Once | INTRAVENOUS | Status: AC
  Administered 2024-01-18: 5 mg via INTRAVENOUS

## 2024-01-18 MED ORDER — SODIUM CHLORIDE 0.9 % IV SOLN: INTRAVENOUS | Status: DC

## 2024-01-18 MED ORDER — ACETAMINOPHEN 325 MG PO TABS: 650.0000 mg | ORAL_TABLET | Freq: Once | ORAL | Status: DC

## 2024-01-18 MED ORDER — ZOLEDRONIC ACID 5 MG/100ML IV SOLN
INTRAVENOUS | Status: AC
  Filled 2024-01-18: qty 100

## 2024-02-11 ENCOUNTER — Other Ambulatory Visit (HOSPITAL_COMMUNITY): Payer: Self-pay | Admitting: Internal Medicine
